# Patient Record
Sex: Male | Born: 1935
Health system: Southern US, Community
[De-identification: ages and names within clinical notes are randomized; demographics above are authoritative.]

## PROBLEM LIST (undated history)

## (undated) DIAGNOSIS — I10 Essential (primary) hypertension: Secondary | ICD-10-CM

## (undated) DIAGNOSIS — J45909 Unspecified asthma, uncomplicated: Secondary | ICD-10-CM

## (undated) DIAGNOSIS — I219 Acute myocardial infarction, unspecified: Secondary | ICD-10-CM

## (undated) DIAGNOSIS — N183 Chronic kidney disease, stage 3 unspecified: Secondary | ICD-10-CM

## (undated) DIAGNOSIS — J810 Acute pulmonary edema: Secondary | ICD-10-CM

## (undated) DIAGNOSIS — I509 Heart failure, unspecified: Secondary | ICD-10-CM

## (undated) HISTORY — PX: CARDIAC SURGERY: SHX584

---

## 1998-08-19 ENCOUNTER — Encounter: Payer: Self-pay | Admitting: Emergency Medicine

## 1998-08-19 ENCOUNTER — Emergency Department (HOSPITAL_COMMUNITY): Admission: EM | Admit: 1998-08-19 | Discharge: 1998-08-19 | Payer: Self-pay | Admitting: Emergency Medicine

## 2000-02-25 ENCOUNTER — Encounter: Payer: Self-pay | Admitting: Emergency Medicine

## 2000-02-26 ENCOUNTER — Encounter: Payer: Self-pay | Admitting: *Deleted

## 2000-02-26 ENCOUNTER — Inpatient Hospital Stay (HOSPITAL_COMMUNITY): Admission: EM | Admit: 2000-02-26 | Discharge: 2000-03-03 | Payer: Self-pay | Admitting: Emergency Medicine

## 2000-02-28 ENCOUNTER — Encounter: Payer: Self-pay | Admitting: Cardiothoracic Surgery

## 2000-02-29 ENCOUNTER — Encounter: Payer: Self-pay | Admitting: Cardiothoracic Surgery

## 2000-03-01 ENCOUNTER — Encounter: Payer: Self-pay | Admitting: Cardiothoracic Surgery

## 2015-12-21 DIAGNOSIS — R0902 Hypoxemia: Secondary | ICD-10-CM | POA: Diagnosis not present

## 2015-12-21 DIAGNOSIS — J449 Chronic obstructive pulmonary disease, unspecified: Secondary | ICD-10-CM | POA: Diagnosis not present

## 2016-01-21 DIAGNOSIS — R0902 Hypoxemia: Secondary | ICD-10-CM | POA: Diagnosis not present

## 2016-01-21 DIAGNOSIS — J449 Chronic obstructive pulmonary disease, unspecified: Secondary | ICD-10-CM | POA: Diagnosis not present

## 2016-02-18 DIAGNOSIS — R0902 Hypoxemia: Secondary | ICD-10-CM | POA: Diagnosis not present

## 2016-02-18 DIAGNOSIS — J449 Chronic obstructive pulmonary disease, unspecified: Secondary | ICD-10-CM | POA: Diagnosis not present

## 2016-03-20 DIAGNOSIS — R0902 Hypoxemia: Secondary | ICD-10-CM | POA: Diagnosis not present

## 2016-03-20 DIAGNOSIS — J449 Chronic obstructive pulmonary disease, unspecified: Secondary | ICD-10-CM | POA: Diagnosis not present

## 2016-04-19 DIAGNOSIS — J449 Chronic obstructive pulmonary disease, unspecified: Secondary | ICD-10-CM | POA: Diagnosis not present

## 2016-04-19 DIAGNOSIS — R0902 Hypoxemia: Secondary | ICD-10-CM | POA: Diagnosis not present

## 2016-05-20 DIAGNOSIS — R0902 Hypoxemia: Secondary | ICD-10-CM | POA: Diagnosis not present

## 2016-05-20 DIAGNOSIS — J449 Chronic obstructive pulmonary disease, unspecified: Secondary | ICD-10-CM | POA: Diagnosis not present

## 2016-06-19 DIAGNOSIS — R0902 Hypoxemia: Secondary | ICD-10-CM | POA: Diagnosis not present

## 2016-06-19 DIAGNOSIS — J449 Chronic obstructive pulmonary disease, unspecified: Secondary | ICD-10-CM | POA: Diagnosis not present

## 2016-07-20 DIAGNOSIS — J449 Chronic obstructive pulmonary disease, unspecified: Secondary | ICD-10-CM | POA: Diagnosis not present

## 2016-07-20 DIAGNOSIS — R0902 Hypoxemia: Secondary | ICD-10-CM | POA: Diagnosis not present

## 2016-07-22 DIAGNOSIS — E7800 Pure hypercholesterolemia, unspecified: Secondary | ICD-10-CM | POA: Diagnosis not present

## 2016-07-22 DIAGNOSIS — I10 Essential (primary) hypertension: Secondary | ICD-10-CM | POA: Diagnosis not present

## 2016-07-22 DIAGNOSIS — J431 Panlobular emphysema: Secondary | ICD-10-CM | POA: Diagnosis not present

## 2016-08-20 DIAGNOSIS — J449 Chronic obstructive pulmonary disease, unspecified: Secondary | ICD-10-CM | POA: Diagnosis not present

## 2016-08-20 DIAGNOSIS — R0902 Hypoxemia: Secondary | ICD-10-CM | POA: Diagnosis not present

## 2016-09-19 DIAGNOSIS — R0902 Hypoxemia: Secondary | ICD-10-CM | POA: Diagnosis not present

## 2016-09-19 DIAGNOSIS — J449 Chronic obstructive pulmonary disease, unspecified: Secondary | ICD-10-CM | POA: Diagnosis not present

## 2016-10-20 DIAGNOSIS — J449 Chronic obstructive pulmonary disease, unspecified: Secondary | ICD-10-CM | POA: Diagnosis not present

## 2016-10-20 DIAGNOSIS — R0902 Hypoxemia: Secondary | ICD-10-CM | POA: Diagnosis not present

## 2016-11-19 DIAGNOSIS — J449 Chronic obstructive pulmonary disease, unspecified: Secondary | ICD-10-CM | POA: Diagnosis not present

## 2016-11-19 DIAGNOSIS — R0902 Hypoxemia: Secondary | ICD-10-CM | POA: Diagnosis not present

## 2016-12-20 DIAGNOSIS — R0902 Hypoxemia: Secondary | ICD-10-CM | POA: Diagnosis not present

## 2016-12-20 DIAGNOSIS — J449 Chronic obstructive pulmonary disease, unspecified: Secondary | ICD-10-CM | POA: Diagnosis not present

## 2017-01-20 DIAGNOSIS — J449 Chronic obstructive pulmonary disease, unspecified: Secondary | ICD-10-CM | POA: Diagnosis not present

## 2017-01-20 DIAGNOSIS — R0902 Hypoxemia: Secondary | ICD-10-CM | POA: Diagnosis not present

## 2017-01-29 DIAGNOSIS — J441 Chronic obstructive pulmonary disease with (acute) exacerbation: Secondary | ICD-10-CM | POA: Diagnosis not present

## 2017-01-29 DIAGNOSIS — J069 Acute upper respiratory infection, unspecified: Secondary | ICD-10-CM | POA: Diagnosis not present

## 2017-01-29 DIAGNOSIS — B9789 Other viral agents as the cause of diseases classified elsewhere: Secondary | ICD-10-CM | POA: Diagnosis not present

## 2017-02-17 DIAGNOSIS — R0902 Hypoxemia: Secondary | ICD-10-CM | POA: Diagnosis not present

## 2017-02-17 DIAGNOSIS — J449 Chronic obstructive pulmonary disease, unspecified: Secondary | ICD-10-CM | POA: Diagnosis not present

## 2017-03-20 DIAGNOSIS — J449 Chronic obstructive pulmonary disease, unspecified: Secondary | ICD-10-CM | POA: Diagnosis not present

## 2017-03-20 DIAGNOSIS — R0902 Hypoxemia: Secondary | ICD-10-CM | POA: Diagnosis not present

## 2017-04-19 DIAGNOSIS — J449 Chronic obstructive pulmonary disease, unspecified: Secondary | ICD-10-CM | POA: Diagnosis not present

## 2017-04-19 DIAGNOSIS — R0902 Hypoxemia: Secondary | ICD-10-CM | POA: Diagnosis not present

## 2017-05-20 DIAGNOSIS — R0902 Hypoxemia: Secondary | ICD-10-CM | POA: Diagnosis not present

## 2017-05-20 DIAGNOSIS — J449 Chronic obstructive pulmonary disease, unspecified: Secondary | ICD-10-CM | POA: Diagnosis not present

## 2017-06-19 DIAGNOSIS — R0902 Hypoxemia: Secondary | ICD-10-CM | POA: Diagnosis not present

## 2017-06-19 DIAGNOSIS — J449 Chronic obstructive pulmonary disease, unspecified: Secondary | ICD-10-CM | POA: Diagnosis not present

## 2017-07-20 DIAGNOSIS — J449 Chronic obstructive pulmonary disease, unspecified: Secondary | ICD-10-CM | POA: Diagnosis not present

## 2017-07-20 DIAGNOSIS — R0902 Hypoxemia: Secondary | ICD-10-CM | POA: Diagnosis not present

## 2017-07-21 DIAGNOSIS — E782 Mixed hyperlipidemia: Secondary | ICD-10-CM | POA: Diagnosis not present

## 2017-07-21 DIAGNOSIS — K219 Gastro-esophageal reflux disease without esophagitis: Secondary | ICD-10-CM | POA: Diagnosis not present

## 2017-07-21 DIAGNOSIS — I10 Essential (primary) hypertension: Secondary | ICD-10-CM | POA: Diagnosis not present

## 2017-07-21 DIAGNOSIS — J4540 Moderate persistent asthma, uncomplicated: Secondary | ICD-10-CM | POA: Diagnosis not present

## 2017-07-31 DIAGNOSIS — I10 Essential (primary) hypertension: Secondary | ICD-10-CM | POA: Diagnosis not present

## 2017-07-31 DIAGNOSIS — E782 Mixed hyperlipidemia: Secondary | ICD-10-CM | POA: Diagnosis not present

## 2017-08-20 DIAGNOSIS — R0902 Hypoxemia: Secondary | ICD-10-CM | POA: Diagnosis not present

## 2017-08-20 DIAGNOSIS — J449 Chronic obstructive pulmonary disease, unspecified: Secondary | ICD-10-CM | POA: Diagnosis not present

## 2017-08-28 DIAGNOSIS — E875 Hyperkalemia: Secondary | ICD-10-CM | POA: Diagnosis not present

## 2017-09-19 DIAGNOSIS — R0902 Hypoxemia: Secondary | ICD-10-CM | POA: Diagnosis not present

## 2017-09-19 DIAGNOSIS — J449 Chronic obstructive pulmonary disease, unspecified: Secondary | ICD-10-CM | POA: Diagnosis not present

## 2017-10-20 DIAGNOSIS — J449 Chronic obstructive pulmonary disease, unspecified: Secondary | ICD-10-CM | POA: Diagnosis not present

## 2017-10-20 DIAGNOSIS — R0902 Hypoxemia: Secondary | ICD-10-CM | POA: Diagnosis not present

## 2017-11-19 DIAGNOSIS — J449 Chronic obstructive pulmonary disease, unspecified: Secondary | ICD-10-CM | POA: Diagnosis not present

## 2017-11-19 DIAGNOSIS — R0902 Hypoxemia: Secondary | ICD-10-CM | POA: Diagnosis not present

## 2017-12-20 DIAGNOSIS — J449 Chronic obstructive pulmonary disease, unspecified: Secondary | ICD-10-CM | POA: Diagnosis not present

## 2017-12-20 DIAGNOSIS — R0902 Hypoxemia: Secondary | ICD-10-CM | POA: Diagnosis not present

## 2018-01-20 DIAGNOSIS — J449 Chronic obstructive pulmonary disease, unspecified: Secondary | ICD-10-CM | POA: Diagnosis not present

## 2018-01-20 DIAGNOSIS — R0902 Hypoxemia: Secondary | ICD-10-CM | POA: Diagnosis not present

## 2018-02-09 DIAGNOSIS — J441 Chronic obstructive pulmonary disease with (acute) exacerbation: Secondary | ICD-10-CM | POA: Diagnosis not present

## 2018-02-09 DIAGNOSIS — R7989 Other specified abnormal findings of blood chemistry: Secondary | ICD-10-CM | POA: Diagnosis not present

## 2018-02-17 DIAGNOSIS — R0902 Hypoxemia: Secondary | ICD-10-CM | POA: Diagnosis not present

## 2018-02-17 DIAGNOSIS — J449 Chronic obstructive pulmonary disease, unspecified: Secondary | ICD-10-CM | POA: Diagnosis not present

## 2018-02-20 DIAGNOSIS — I10 Essential (primary) hypertension: Secondary | ICD-10-CM | POA: Diagnosis not present

## 2018-02-20 DIAGNOSIS — J441 Chronic obstructive pulmonary disease with (acute) exacerbation: Secondary | ICD-10-CM | POA: Diagnosis not present

## 2018-02-20 DIAGNOSIS — J40 Bronchitis, not specified as acute or chronic: Secondary | ICD-10-CM | POA: Diagnosis not present

## 2018-02-20 DIAGNOSIS — B370 Candidal stomatitis: Secondary | ICD-10-CM | POA: Diagnosis not present

## 2018-03-16 DIAGNOSIS — Z79899 Other long term (current) drug therapy: Secondary | ICD-10-CM | POA: Diagnosis not present

## 2018-03-16 DIAGNOSIS — D61818 Other pancytopenia: Secondary | ICD-10-CM | POA: Diagnosis not present

## 2018-03-16 DIAGNOSIS — I872 Venous insufficiency (chronic) (peripheral): Secondary | ICD-10-CM | POA: Diagnosis not present

## 2018-03-16 DIAGNOSIS — R600 Localized edema: Secondary | ICD-10-CM | POA: Diagnosis not present

## 2018-03-16 DIAGNOSIS — N183 Chronic kidney disease, stage 3 (moderate): Secondary | ICD-10-CM | POA: Diagnosis not present

## 2018-03-17 ENCOUNTER — Emergency Department (HOSPITAL_COMMUNITY): Payer: PPO

## 2018-03-17 ENCOUNTER — Other Ambulatory Visit (HOSPITAL_COMMUNITY): Payer: Self-pay

## 2018-03-17 ENCOUNTER — Other Ambulatory Visit: Payer: Self-pay

## 2018-03-17 ENCOUNTER — Encounter (HOSPITAL_COMMUNITY): Payer: Self-pay

## 2018-03-17 ENCOUNTER — Inpatient Hospital Stay (HOSPITAL_COMMUNITY)
Admission: EM | Admit: 2018-03-17 | Discharge: 2018-03-22 | DRG: 291 | Disposition: A | Discharge status: Home Health | Payer: PPO | Attending: Internal Medicine | Admitting: Internal Medicine

## 2018-03-17 DIAGNOSIS — I5041 Acute combined systolic (congestive) and diastolic (congestive) heart failure: Secondary | ICD-10-CM | POA: Diagnosis not present

## 2018-03-17 DIAGNOSIS — E878 Other disorders of electrolyte and fluid balance, not elsewhere classified: Secondary | ICD-10-CM | POA: Diagnosis not present

## 2018-03-17 DIAGNOSIS — Z9981 Dependence on supplemental oxygen: Secondary | ICD-10-CM | POA: Diagnosis not present

## 2018-03-17 DIAGNOSIS — I2510 Atherosclerotic heart disease of native coronary artery without angina pectoris: Secondary | ICD-10-CM | POA: Diagnosis present

## 2018-03-17 DIAGNOSIS — I252 Old myocardial infarction: Secondary | ICD-10-CM

## 2018-03-17 DIAGNOSIS — I452 Bifascicular block: Secondary | ICD-10-CM | POA: Diagnosis present

## 2018-03-17 DIAGNOSIS — R0902 Hypoxemia: Secondary | ICD-10-CM | POA: Diagnosis not present

## 2018-03-17 DIAGNOSIS — Z7982 Long term (current) use of aspirin: Secondary | ICD-10-CM | POA: Diagnosis not present

## 2018-03-17 DIAGNOSIS — I5021 Acute systolic (congestive) heart failure: Secondary | ICD-10-CM | POA: Diagnosis not present

## 2018-03-17 DIAGNOSIS — I255 Ischemic cardiomyopathy: Secondary | ICD-10-CM | POA: Diagnosis not present

## 2018-03-17 DIAGNOSIS — I25700 Atherosclerosis of coronary artery bypass graft(s), unspecified, with unstable angina pectoris: Secondary | ICD-10-CM | POA: Diagnosis not present

## 2018-03-17 DIAGNOSIS — T502X5A Adverse effect of carbonic-anhydrase inhibitors, benzothiadiazides and other diuretics, initial encounter: Secondary | ICD-10-CM | POA: Diagnosis not present

## 2018-03-17 DIAGNOSIS — J9611 Chronic respiratory failure with hypoxia: Secondary | ICD-10-CM | POA: Diagnosis not present

## 2018-03-17 DIAGNOSIS — I130 Hypertensive heart and chronic kidney disease with heart failure and stage 1 through stage 4 chronic kidney disease, or unspecified chronic kidney disease: Secondary | ICD-10-CM | POA: Diagnosis not present

## 2018-03-17 DIAGNOSIS — N183 Chronic kidney disease, stage 3 (moderate): Secondary | ICD-10-CM | POA: Diagnosis present

## 2018-03-17 DIAGNOSIS — J449 Chronic obstructive pulmonary disease, unspecified: Secondary | ICD-10-CM | POA: Diagnosis present

## 2018-03-17 DIAGNOSIS — I872 Venous insufficiency (chronic) (peripheral): Secondary | ICD-10-CM | POA: Diagnosis not present

## 2018-03-17 DIAGNOSIS — R600 Localized edema: Secondary | ICD-10-CM | POA: Diagnosis not present

## 2018-03-17 DIAGNOSIS — I129 Hypertensive chronic kidney disease with stage 1 through stage 4 chronic kidney disease, or unspecified chronic kidney disease: Secondary | ICD-10-CM | POA: Diagnosis not present

## 2018-03-17 DIAGNOSIS — L03116 Cellulitis of left lower limb: Secondary | ICD-10-CM | POA: Diagnosis not present

## 2018-03-17 DIAGNOSIS — R0600 Dyspnea, unspecified: Secondary | ICD-10-CM | POA: Diagnosis not present

## 2018-03-17 DIAGNOSIS — K219 Gastro-esophageal reflux disease without esophagitis: Secondary | ICD-10-CM | POA: Diagnosis present

## 2018-03-17 DIAGNOSIS — R0602 Shortness of breath: Secondary | ICD-10-CM | POA: Diagnosis not present

## 2018-03-17 DIAGNOSIS — Z6832 Body mass index (BMI) 32.0-32.9, adult: Secondary | ICD-10-CM | POA: Diagnosis not present

## 2018-03-17 DIAGNOSIS — Z7951 Long term (current) use of inhaled steroids: Secondary | ICD-10-CM

## 2018-03-17 DIAGNOSIS — E785 Hyperlipidemia, unspecified: Secondary | ICD-10-CM | POA: Diagnosis present

## 2018-03-17 DIAGNOSIS — Z951 Presence of aortocoronary bypass graft: Secondary | ICD-10-CM

## 2018-03-17 DIAGNOSIS — I4581 Long QT syndrome: Secondary | ICD-10-CM | POA: Diagnosis present

## 2018-03-17 DIAGNOSIS — Z720 Tobacco use: Secondary | ICD-10-CM | POA: Diagnosis not present

## 2018-03-17 DIAGNOSIS — N179 Acute kidney failure, unspecified: Secondary | ICD-10-CM

## 2018-03-17 DIAGNOSIS — J810 Acute pulmonary edema: Secondary | ICD-10-CM

## 2018-03-17 HISTORY — DX: Essential (primary) hypertension: I10

## 2018-03-17 HISTORY — DX: Chronic kidney disease, stage 3 unspecified: N18.30

## 2018-03-17 HISTORY — DX: Acute pulmonary edema: J81.0

## 2018-03-17 HISTORY — DX: Chronic kidney disease, stage 3 (moderate): N18.3

## 2018-03-17 HISTORY — DX: Heart failure, unspecified: I50.9

## 2018-03-17 HISTORY — DX: Unspecified asthma, uncomplicated: J45.909

## 2018-03-17 HISTORY — DX: Acute myocardial infarction, unspecified: I21.9

## 2018-03-17 LAB — CBC
HEMATOCRIT: 33.2 % — AB (ref 39.0–52.0)
Hemoglobin: 9.9 g/dL — ABNORMAL LOW (ref 13.0–17.0)
MCH: 26.5 pg (ref 26.0–34.0)
MCHC: 29.8 g/dL — ABNORMAL LOW (ref 30.0–36.0)
MCV: 89 fL (ref 78.0–100.0)
Platelets: 101 10*3/uL — ABNORMAL LOW (ref 150–400)
RBC: 3.73 MIL/uL — AB (ref 4.22–5.81)
RDW: 16 % — AB (ref 11.5–15.5)
WBC: 6.2 10*3/uL (ref 4.0–10.5)

## 2018-03-17 LAB — BASIC METABOLIC PANEL
ANION GAP: 10 (ref 5–15)
BUN: 21 mg/dL — AB (ref 6–20)
CO2: 30 mmol/L (ref 22–32)
Calcium: 8.7 mg/dL — ABNORMAL LOW (ref 8.9–10.3)
Chloride: 99 mmol/L — ABNORMAL LOW (ref 101–111)
Creatinine, Ser: 1.17 mg/dL (ref 0.61–1.24)
GFR, EST NON AFRICAN AMERICAN: 56 mL/min — AB (ref >60)
Glucose, Bld: 151 mg/dL — ABNORMAL HIGH (ref 65–99)
POTASSIUM: 3.7 mmol/L (ref 3.5–5.1)
SODIUM: 139 mmol/L (ref 135–145)

## 2018-03-17 LAB — I-STAT TROPONIN, ED: Troponin i, poc: 0.04 ng/mL (ref 0.00–0.08)

## 2018-03-17 LAB — BRAIN NATRIURETIC PEPTIDE: B NATRIURETIC PEPTIDE 5: 1234.4 pg/mL — AB (ref 0.0–100.0)

## 2018-03-17 NOTE — ED Triage Notes (Signed)
Pt presents with 2 week h/o increased shortness of breath and BLE edema.  Pt seen at PCP yesterday, lasix was adjusted, pt referred here.

## 2018-03-18 ENCOUNTER — Other Ambulatory Visit: Payer: Self-pay

## 2018-03-18 ENCOUNTER — Inpatient Hospital Stay (HOSPITAL_COMMUNITY): Payer: PPO

## 2018-03-18 ENCOUNTER — Encounter (HOSPITAL_COMMUNITY): Payer: Self-pay | Admitting: Internal Medicine

## 2018-03-18 DIAGNOSIS — I5021 Acute systolic (congestive) heart failure: Secondary | ICD-10-CM

## 2018-03-18 DIAGNOSIS — N179 Acute kidney failure, unspecified: Secondary | ICD-10-CM

## 2018-03-18 DIAGNOSIS — J810 Acute pulmonary edema: Secondary | ICD-10-CM

## 2018-03-18 DIAGNOSIS — R0600 Dyspnea, unspecified: Secondary | ICD-10-CM

## 2018-03-18 DIAGNOSIS — N183 Chronic kidney disease, stage 3 unspecified: Secondary | ICD-10-CM

## 2018-03-18 DIAGNOSIS — E785 Hyperlipidemia, unspecified: Secondary | ICD-10-CM | POA: Diagnosis present

## 2018-03-18 DIAGNOSIS — I452 Bifascicular block: Secondary | ICD-10-CM | POA: Diagnosis present

## 2018-03-18 DIAGNOSIS — L03116 Cellulitis of left lower limb: Secondary | ICD-10-CM

## 2018-03-18 DIAGNOSIS — I4581 Long QT syndrome: Secondary | ICD-10-CM | POA: Diagnosis present

## 2018-03-18 DIAGNOSIS — Z9981 Dependence on supplemental oxygen: Secondary | ICD-10-CM | POA: Diagnosis not present

## 2018-03-18 DIAGNOSIS — Z951 Presence of aortocoronary bypass graft: Secondary | ICD-10-CM | POA: Diagnosis not present

## 2018-03-18 DIAGNOSIS — Z7951 Long term (current) use of inhaled steroids: Secondary | ICD-10-CM | POA: Diagnosis not present

## 2018-03-18 DIAGNOSIS — I5041 Acute combined systolic (congestive) and diastolic (congestive) heart failure: Secondary | ICD-10-CM | POA: Insufficient documentation

## 2018-03-18 DIAGNOSIS — Z7982 Long term (current) use of aspirin: Secondary | ICD-10-CM | POA: Diagnosis not present

## 2018-03-18 DIAGNOSIS — R600 Localized edema: Secondary | ICD-10-CM

## 2018-03-18 DIAGNOSIS — Z6832 Body mass index (BMI) 32.0-32.9, adult: Secondary | ICD-10-CM | POA: Diagnosis not present

## 2018-03-18 DIAGNOSIS — K219 Gastro-esophageal reflux disease without esophagitis: Secondary | ICD-10-CM | POA: Diagnosis present

## 2018-03-18 DIAGNOSIS — E878 Other disorders of electrolyte and fluid balance, not elsewhere classified: Secondary | ICD-10-CM | POA: Diagnosis present

## 2018-03-18 DIAGNOSIS — I2510 Atherosclerotic heart disease of native coronary artery without angina pectoris: Secondary | ICD-10-CM | POA: Diagnosis present

## 2018-03-18 DIAGNOSIS — I130 Hypertensive heart and chronic kidney disease with heart failure and stage 1 through stage 4 chronic kidney disease, or unspecified chronic kidney disease: Secondary | ICD-10-CM | POA: Diagnosis present

## 2018-03-18 DIAGNOSIS — I255 Ischemic cardiomyopathy: Secondary | ICD-10-CM | POA: Diagnosis present

## 2018-03-18 DIAGNOSIS — T502X5A Adverse effect of carbonic-anhydrase inhibitors, benzothiadiazides and other diuretics, initial encounter: Secondary | ICD-10-CM | POA: Diagnosis not present

## 2018-03-18 DIAGNOSIS — J9611 Chronic respiratory failure with hypoxia: Secondary | ICD-10-CM | POA: Diagnosis present

## 2018-03-18 DIAGNOSIS — Z720 Tobacco use: Secondary | ICD-10-CM | POA: Diagnosis not present

## 2018-03-18 DIAGNOSIS — J449 Chronic obstructive pulmonary disease, unspecified: Secondary | ICD-10-CM | POA: Diagnosis present

## 2018-03-18 DIAGNOSIS — I252 Old myocardial infarction: Secondary | ICD-10-CM | POA: Diagnosis not present

## 2018-03-18 DIAGNOSIS — I872 Venous insufficiency (chronic) (peripheral): Secondary | ICD-10-CM | POA: Diagnosis present

## 2018-03-18 HISTORY — DX: Acute pulmonary edema: J81.0

## 2018-03-18 LAB — CREATININE, SERUM
Creatinine, Ser: 1.12 mg/dL (ref 0.61–1.24)
GFR, EST NON AFRICAN AMERICAN: 59 mL/min — AB (ref >60)

## 2018-03-18 LAB — ECHOCARDIOGRAM COMPLETE

## 2018-03-18 LAB — CBC
HEMATOCRIT: 33.2 % — AB (ref 39.0–52.0)
HEMOGLOBIN: 10 g/dL — AB (ref 13.0–17.0)
MCH: 26.7 pg (ref 26.0–34.0)
MCHC: 30.1 g/dL (ref 30.0–36.0)
MCV: 88.8 fL (ref 78.0–100.0)
Platelets: 94 10*3/uL — ABNORMAL LOW (ref 150–400)
RBC: 3.74 MIL/uL — AB (ref 4.22–5.81)
RDW: 15.9 % — ABNORMAL HIGH (ref 11.5–15.5)
WBC: 5.9 10*3/uL (ref 4.0–10.5)

## 2018-03-18 MED ORDER — SODIUM CHLORIDE 0.9% FLUSH: 3.0000 mL | INTRAVENOUS | Status: DC | PRN

## 2018-03-18 MED ORDER — FUROSEMIDE 10 MG/ML IJ SOLN
60.0000 mg | Freq: Once | INTRAMUSCULAR | Status: AC
Start: 2018-03-18 — End: 2018-03-18
  Administered 2018-03-18: 60 mg via INTRAVENOUS
  Filled 2018-03-18: qty 6

## 2018-03-18 MED ORDER — DOXYCYCLINE HYCLATE 100 MG PO TABS
100.0000 mg | ORAL_TABLET | Freq: Once | ORAL | Status: AC
  Administered 2018-03-18: 100 mg via ORAL
  Filled 2018-03-18: qty 1

## 2018-03-18 MED ORDER — PANTOPRAZOLE SODIUM 40 MG PO TBEC
40.0000 mg | DELAYED_RELEASE_TABLET | Freq: Every day | ORAL | Status: DC
  Administered 2018-03-18 – 2018-03-22 (×5): 40 mg via ORAL
  Filled 2018-03-18 (×5): qty 1

## 2018-03-18 MED ORDER — PERFLUTREN LIPID MICROSPHERE
1.0000 mL | INTRAVENOUS | Status: AC | PRN
  Administered 2018-03-18: 2 mL via INTRAVENOUS
  Filled 2018-03-18: qty 10

## 2018-03-18 MED ORDER — DOXAZOSIN MESYLATE 8 MG PO TABS
4.0000 mg | ORAL_TABLET | Freq: Every day | ORAL | Status: DC
  Administered 2018-03-18 – 2018-03-20 (×3): 4 mg via ORAL
  Filled 2018-03-18: qty 1
  Filled 2018-03-18: qty 2
  Filled 2018-03-18 (×2): qty 1

## 2018-03-18 MED ORDER — FUROSEMIDE 10 MG/ML IJ SOLN: 80.0000 mg | Freq: Once | INTRAMUSCULAR | Status: DC

## 2018-03-18 MED ORDER — FUROSEMIDE 10 MG/ML IJ SOLN
60.0000 mg | Freq: Two times a day (BID) | INTRAMUSCULAR | Status: DC
  Administered 2018-03-18 – 2018-03-21 (×6): 60 mg via INTRAVENOUS
  Filled 2018-03-18 (×6): qty 6

## 2018-03-18 MED ORDER — MAGNESIUM SULFATE 2 GM/50ML IV SOLN
2.0000 g | Freq: Once | INTRAVENOUS | Status: AC
  Administered 2018-03-18: 2 g via INTRAVENOUS
  Filled 2018-03-18: qty 50

## 2018-03-18 MED ORDER — ASPIRIN EC 325 MG PO TBEC
325.0000 mg | DELAYED_RELEASE_TABLET | Freq: Every day | ORAL | Status: DC
  Administered 2018-03-18 – 2018-03-22 (×5): 325 mg via ORAL
  Filled 2018-03-18 (×5): qty 1

## 2018-03-18 MED ORDER — SODIUM CHLORIDE 0.9% FLUSH
3.0000 mL | Freq: Two times a day (BID) | INTRAVENOUS | Status: DC
  Administered 2018-03-18 – 2018-03-22 (×8): 3 mL via INTRAVENOUS

## 2018-03-18 MED ORDER — HEPARIN SODIUM (PORCINE) 5000 UNIT/ML IJ SOLN
5000.0000 [IU] | Freq: Three times a day (TID) | INTRAMUSCULAR | Status: DC
  Administered 2018-03-18 – 2018-03-22 (×12): 5000 [IU] via SUBCUTANEOUS
  Filled 2018-03-18 (×12): qty 1

## 2018-03-18 MED ORDER — POTASSIUM CHLORIDE CRYS ER 20 MEQ PO TBCR
40.0000 meq | EXTENDED_RELEASE_TABLET | Freq: Two times a day (BID) | ORAL | Status: AC
  Administered 2018-03-18 (×2): 40 meq via ORAL
  Filled 2018-03-18 (×2): qty 2

## 2018-03-18 MED ORDER — ONDANSETRON HCL 4 MG/2ML IJ SOLN: 4.0000 mg | Freq: Four times a day (QID) | INTRAMUSCULAR | Status: DC | PRN

## 2018-03-18 MED ORDER — FUROSEMIDE 10 MG/ML IJ SOLN
80.0000 mg | Freq: Once | INTRAMUSCULAR | Status: AC
Start: 2018-03-18 — End: 2018-03-18
  Administered 2018-03-18: 80 mg via INTRAVENOUS
  Filled 2018-03-18: qty 8

## 2018-03-18 MED ORDER — IPRATROPIUM-ALBUTEROL 0.5-2.5 (3) MG/3ML IN SOLN
3.0000 mL | RESPIRATORY_TRACT | Status: DC | PRN
  Administered 2018-03-19 – 2018-03-20 (×2): 3 mL via RESPIRATORY_TRACT
  Filled 2018-03-18 (×2): qty 3

## 2018-03-18 MED ORDER — ACETAMINOPHEN 325 MG PO TABS
650.0000 mg | ORAL_TABLET | ORAL | Status: DC | PRN
  Administered 2018-03-21: 650 mg via ORAL
  Filled 2018-03-18: qty 2

## 2018-03-18 MED ORDER — SODIUM CHLORIDE 0.9 % IV SOLN: 250.0000 mL | INTRAVENOUS | Status: DC | PRN

## 2018-03-18 MED ORDER — UMECLIDINIUM-VILANTEROL 62.5-25 MCG/INH IN AEPB
1.0000 | INHALATION_SPRAY | Freq: Every day | RESPIRATORY_TRACT | Status: DC
  Administered 2018-03-20 – 2018-03-22 (×3): 1 via RESPIRATORY_TRACT
  Filled 2018-03-18 (×3): qty 14

## 2018-03-18 MED ORDER — LISINOPRIL 5 MG PO TABS
5.0000 mg | ORAL_TABLET | Freq: Every day | ORAL | Status: DC
  Administered 2018-03-18 – 2018-03-20 (×2): 5 mg via ORAL
  Filled 2018-03-18 (×4): qty 1

## 2018-03-18 NOTE — ED Notes (Addendum)
Report accepted by Corrie DandyMary, RN

## 2018-03-18 NOTE — ED Provider Notes (Signed)
[facility] Provider Note   CSN: [id number] Arrival date & time: 03/17/18  2004     History   Chief Complaint Chief Complaint  Patient presents with  . Shortness of Breath    HPI Robert Maynard is a 84 y.o. male.  HPI  This is an 84 year old male with a history of hypertension, coronary artery disease, asthma who presents with worsening lower extremity edema and shortness of breath.  At baseline, patient is on 2 L of home oxygen and sleeps in a recliner.  He progressively over the last several weeks has not been able to lay the recliner flat secondary to shortness of breath.  Also has had notably increased bilateral lower extremity edema and weeping most from his left lower extremity.  He was seen by his primary doctor on Monday.  Told to double up on his Lasix.  He takes Lasix 20 mg twice daily as needed for swelling.  Patient reports that he took 40 mg on Monday night and Tuesday morning.  If anything his lower extremity swelling worsened.  He reports decreased urine output.  He denies any chest pain.  He denies any recent coughs or fevers.  He has noted some left lower extremity redness and weeping secondary to swelling.  Past Medical History:  Diagnosis Date  . Asthma   . Hypertension   . MI (myocardial infarction) (HCC)     There are no active problems to display for this patient.   Past Surgical History:  Procedure Laterality Date  . CARDIAC SURGERY          Home Medications    Prior to Admission medications   Not on File    Family History History reviewed. No pertinent family history.  Social History Social History   Tobacco Use  . Smoking status: Former Games developermoker  . Smokeless tobacco: Current User    Types: Chew  Substance Use Topics  . Alcohol use: Not on file  . Drug use: Not on file     Allergies   Penicillins   Review of Systems Review of Systems  Constitutional: Negative for fever.  Respiratory:  Positive for shortness of breath. Negative for cough.   Cardiovascular: Positive for leg swelling. Negative for chest pain.  Gastrointestinal: Negative for abdominal pain, nausea and vomiting.  Genitourinary:       Decreased urination  Skin: Positive for color change.  All other systems reviewed and are negative.    Physical Exam Updated Vital Signs BP 121/76 (BP Location: Right Arm)   Pulse 65   Temp 98.4 F (36.9 C) (Oral)   Resp 16   SpO2 99%   Physical Exam  Constitutional: He is oriented to person, place, and time.  Elderly, ill-appearing but nontoxic  HENT:  Head: Normocephalic and atraumatic.  Eyes: Pupils are equal, round, and reactive to light.  Cardiovascular: Normal rate, regular rhythm and normal heart sounds.  No murmur heard. Pulmonary/Chest: Tachypnea noted. No respiratory distress. He has wheezes.  Tachypnea with pursed lips, increased work of breathing, wheezing noted in all lung fields with crackles in the bilateral bases  Abdominal: Soft. Bowel sounds are normal. There is no tenderness. There is no rebound.  Musculoskeletal:       Right lower leg: He exhibits edema.       Left lower leg: He exhibits edema.  3+ pitting edema with weeping bilateral lower extremities  Lymphadenopathy:    He has no cervical adenopathy.  Neurological: He  is alert and oriented to person, place, and time.  Skin: Skin is warm and dry.  Erythema and warmth noted over the left shin, blanching  Psychiatric: He has a normal mood and affect.  Nursing note and vitals reviewed.    ED Treatments / Results  Labs (all labs ordered are listed, but only abnormal results are displayed) Labs Reviewed  BASIC METABOLIC PANEL - Abnormal; Notable for the following components:      Result Value   Chloride 99 (*)    Glucose, Bld 151 (*)    BUN 21 (*)    Calcium 8.7 (*)    GFR calc non Af Amer 56 (*)    All other components within normal limits  CBC - Abnormal; Notable for the following  components:   RBC 3.73 (*)    Hemoglobin 9.9 (*)    HCT 33.2 (*)    MCHC 29.8 (*)    RDW 16.0 (*)    Platelets 101 (*)    All other components within normal limits  BRAIN NATRIURETIC PEPTIDE - Abnormal; Notable for the following components:   B Natriuretic Peptide 1,234.4 (*)    All other components within normal limits  I-STAT TROPONIN, ED    EKG EKG Interpretation  Date/Time:  Tuesday March 17 2018 21:37:37 EDT Ventricular Rate:  98 PR Interval:  194 QRS Duration: 150 QT Interval:  424 QTC Calculation: 541 R Axis:   -73 Text Interpretation:  Sinus rhythm with Fusion complexes and Premature atrial complexes Left axis deviation Left ventricular hypertrophy with QRS widening and repolarization abnormality Nonspecific intraventricular conduction delay Inferior infarct , age undetermined Anterolateral infarct , age undetermined Abnormal ECG Confirmed by Ross Marcus ([id number]) on 03/18/2018 4:01:07 AM   Radiology Dg Chest 2 View  Result Date: 03/17/2018 CLINICAL DATA:  Two week history of increased shortness of breath and bilateral lower extremity edema. EXAM: CHEST - 2 VIEW COMPARISON:  None. FINDINGS: Postoperative changes in the mediastinum. Fractures of 2 superior sternotomy wires. Diffuse cardiac enlargement. Mild pulmonary vascular congestion. Slight interstitial pattern in the lung bases likely due to edema. Small bilateral pleural effusions. No focal consolidation. No pneumothorax. Calcified and tortuous aorta. Degenerative changes in the spine. IMPRESSION: 1. Congestive changes with cardiac enlargement, pulmonary vascular congestion, interstitial edema, and bilateral pleural effusions. 2. Aortic atherosclerosis. Electronically Signed   By: Burman Nieves M.D.   On: 03/17/2018 22:27    Procedures Procedures (including critical care time)  Medications Ordered in ED Medications  furosemide (LASIX) injection 60 mg (has no administration in time range)     Initial  Impression / Assessment and Plan / ED Course  I have reviewed the triage vital signs and the nursing notes.  Pertinent labs & imaging results that were available during my care of the patient were reviewed by me and considered in my medical decision making (see chart for details).     Patient presents with progressively worsening lower extremity edema and shortness of breath.  Reports orthopnea.  He appears to be in mild respiratory distress with tachypnea and increased work of breathing.  He is satting 99% on 2 L nasal cannula.  He has extensive lower extremity swelling and likely left lower extremity cellulitis.  Chest x-ray shows evidence of vascular congestion and bilateral pleural effusions.  Unfortunately, I do not have any prior echo or additional information available.  Agree that he likely needs aggressive diuresis.  Patient takes 40 mg of Lasix daily normally.  Patient was given 29  mg of IV Lasix.  While patient is not hypoxic, given the extent of his volume overload, feel he needs admission for multiple doses of IV Lasix and diuresis given that he is not responding to oral Lasix at home.  Will discuss with admitting hospitalist.   Final Clinical Impressions(s) / ED Diagnoses   Final diagnoses:  Acute pulmonary edema (HCC)  Lower extremity edema  Left leg cellulitis    ED Discharge Orders    None       Shon Baton, MD 03/18/18 [phone]

## 2018-03-18 NOTE — Progress Notes (Signed)
Echocardiogram 2D Echocardiogram has been performed.  Robert Maynard 03/18/2018, 2:16 PM

## 2018-03-18 NOTE — Plan of Care (Signed)
  Problem: Education: Goal: Knowledge of General Education information will improve Outcome: Progressing   Problem: Clinical Measurements: Goal: Ability to maintain clinical measurements within normal limits will improve Outcome: Progressing   Problem: Clinical Measurements: Goal: Diagnostic test results will improve Outcome: Progressing   Problem: Clinical Measurements: Goal: Respiratory complications will improve Outcome: Progressing

## 2018-03-18 NOTE — Progress Notes (Signed)
Transported patient from 5c-18 in bed with 2 nurses and daughter gail. Patient is from home with wife. Has expressed that patient  has got weak with swelling and needs pt/ot eval.

## 2018-03-18 NOTE — ED Notes (Signed)
Attending at bedside.

## 2018-03-18 NOTE — ED Notes (Signed)
Repeat EKG was done and Given to Amitting Doctor.

## 2018-03-18 NOTE — H&P (Signed)
History and Physical    Robert Maynard DOB: [birth year] DOA: 03/17/2018  PCP: Brock Bad, FNP  Patient coming from: home  I have personally briefly reviewed patient's old medical records in [place]  Chief Complaint: SOB  HPI: Robert Maynard is a 84 y.o. male with medical history significant of past medical history of coronary artery disease, chronic kidney disease stage II, COPD dependent on 2 L of oxygen, asthma essential hypertension who is a poor historian so most of the history is obtained from the chart who comes in for lower extremity edema and shortness of breath he saw his primary care doctor on 03/16/2017 who he double his dose of Lasix but as per chart his shortness of breath did not improve, he had orthopnea and his lower extremity edema did not improve.  She denies any chest pain any fever, any new coughs.  ED Course:  Blood pressure seems to be stable his heart rate has been ranging from 88-99, he is mildly hypochloremic, with a BNP of 1200, hemoglobin of 9 and platelet count of 100.  Chest x-ray as below  Review of Systems: As per HPI otherwise 10 point review of systems negative.    Past Medical History:  Diagnosis Date  . Asthma   . Hypertension   . MI (myocardial infarction) [facility])     Past Surgical History:  Procedure Laterality Date  . CARDIAC SURGERY       reports that he has quit smoking. His smoking use included cigarettes. He has a 60.00 pack-year smoking history. His smokeless tobacco use includes chew. His alcohol and drug histories are not on file.  Allergies  Allergen Reactions  . Penicillins Rash    History reviewed. No pertinent family history. He relates his father died of heart attack at the age of 72  Prior to Admission medications   Medication Sig Start Date End Date Taking? Authorizing Provider  aspirin EC 325 MG tablet Take 325 mg by mouth daily.   Yes [provider]  doxazosin (CARDURA) 4 MG tablet  Take 4 mg by mouth daily.   Yes [provider]  furosemide (LASIX) 20 MG tablet Take 20 mg by mouth 2 (two) times daily as needed for fluid.   Yes [provider]  pantoprazole (PROTONIX) 40 MG tablet Take 40 mg by mouth daily.   Yes [provider]  umeclidinium-vilanterol (ANORO ELLIPTA) 62.5-25 MCG/INH AEPB Inhale 1 puff into the lungs daily.   Yes [provider]    Physical Exam: Vitals:   03/18/18 0158 03/18/18 0415 03/18/18 0615 03/18/18 0710  BP: 121/76 117/79 120/85 115/79  Pulse: 65 81 93 98  Resp: 16 20  (!) 27  Temp:      TempSrc:      SpO2: 99% 98% 96% 96%    Constitutional: NAD, calm, comfortable Vitals:   03/18/18 0158 03/18/18 0415 03/18/18 0615 03/18/18 0710  BP: 121/76 117/79 120/85 115/79  Pulse: 65 81 93 98  Resp: 16 20  (!) 27  Temp:      TempSrc:      SpO2: 99% 98% 96% 96%   Eyes: PERRL, lids and conjunctivae normal ENMT: Mucous membranes are moist. Posterior pharynx clear of any exudate or lesions.Normal dentition.  Neck: Supple no masses positive JVD to the earlobe Respiratory: Good air movement no wheezing but crackles bilaterally at bases Cardiovascular: Rate and rhythm with positive S1-S2, could appreciate an S3 Abdomen: no tenderness, no masses palpated.  No hepatosplenomegaly. Bowel sounds positive.  Musculoskeletal: Plus edema in his lower extremity Skin: Right lower extremity showed a significant erythema and warmth to touch Neurologic: CN 2-12 grossly intact. Sensation intact, DTR normal. Strength 5/5 in all 4.  Psychiatric: Normal judgment and insight. Alert and oriented x 3. Normal mood.     Labs on Admission: I have personally reviewed following labs and imaging studies  CBC: Recent Labs  Lab 03/17/18 2138  WBC 6.2  HGB 9.9*  HCT 33.2*  MCV 89.0  PLT 270*   Basic Metabolic Panel: Recent Labs  Lab 03/17/18 2138  NA 139  K 3.7  CL 99*  CO2 30  GLUCOSE 151*  BUN 21*  CREATININE 1.17    CALCIUM 8.7*   GFR: CrCl cannot be calculated (Unknown ideal weight.). Liver Function Tests: No results for input(s): AST, ALT, ALKPHOS, BILITOT, PROT, ALBUMIN in the last 168 hours. No results for input(s): LIPASE, AMYLASE in the last 168 hours. No results for input(s): AMMONIA in the last 168 hours. Coagulation Profile: No results for input(s): INR, PROTIME in the last 168 hours. Cardiac Enzymes: No results for input(s): CKTOTAL, CKMB, CKMBINDEX, TROPONINI in the last 168 hours. BNP (last 3 results) No results for input(s): PROBNP in the last 8760 hours. HbA1C: No results for input(s): HGBA1C in the last 72 hours. CBG: No results for input(s): GLUCAP in the last 168 hours. Lipid Profile: No results for input(s): CHOL, HDL, LDLCALC, TRIG, CHOLHDL, LDLDIRECT in the last 72 hours. Thyroid Function Tests: No results for input(s): TSH, T4TOTAL, FREET4, T3FREE, THYROIDAB in the last 72 hours. Anemia Panel: No results for input(s): VITAMINB12, FOLATE, FERRITIN, TIBC, IRON, RETICCTPCT in the last 72 hours. Urine analysis: No results found for: COLORURINE, APPEARANCEUR, LABSPEC, PHURINE, GLUCOSEU, HGBUR, BILIRUBINUR, KETONESUR, PROTEINUR, UROBILINOGEN, NITRITE, LEUKOCYTESUR  Radiological Exams on Admission: Dg Chest 2 View  Result Date: 03/17/2018 CLINICAL DATA:  Two week history of increased shortness of breath and bilateral lower extremity edema. EXAM: CHEST - 2 VIEW COMPARISON:  None. FINDINGS: Postoperative changes in the mediastinum. Fractures of 2 superior sternotomy wires. Diffuse cardiac enlargement. Mild pulmonary vascular congestion. Slight interstitial pattern in the lung bases likely due to edema. Small bilateral pleural effusions. No focal consolidation. No pneumothorax. Calcified and tortuous aorta. Degenerative changes in the spine. IMPRESSION: 1. Congestive changes with cardiac enlargement, pulmonary vascular congestion, interstitial edema, and bilateral pleural effusions. 2.  Aortic atherosclerosis. Electronically Signed   By: Lucienne Capers M.D.   On: 03/17/2018 22:27    EKG: Independently reviewed.  Shows sinus rhythm with multiple PVCs, left axis deviation right bundle branch block and left ventricular hypertrophy with a mildly prolonged QTC  Assessment/Plan Acute systolic CHF (congestive heart failure) ([place]) I have looked through the records including care everywhere I cannot find a 2D echo, his chest x-ray shows significantly dilated cardiac silhouette.  I could appreciate an S3 on physical exam, so I am concerned about a reduced ejection fraction, he has denied chest pain now or over the last 2 months. He is extremely fluid overloaded. We will start him on aggressive IV Lasix, replete potassium orally, will also repeat a magnesium and check it in the morning. We will restrict his fluids daily weight strict I's and O's and put him on a low-sodium diet.  We will monitor electrolytes regularly. I am concerned that his renal function is hemodiluted due to his significant volume overload. Expect this creatinine to go up as we had him to his estimated dry  weight. I cannot find an estimated dry weight in his chart. We will start him on low-dose lisinopril. Over the next 2 days we will have a low threshold to start a BRCA2 blocker on or before discharge  Cellulitis of left lower extremity Clearly has a left lower extremity cellulitis we will start her on IV doxy will check blood cultures.  CKD (chronic kidney disease), stage III ([place]) I think his creatinine is hemodiluted due to his massive volume overload. We will continue to monitor creatinine closely.  Prolonged QTC: We will give magnesium IV recheck magnesium tomorrow morning.   DVT prophylaxis: Heparin Code Status: full Family Communication: none Disposition Plan: hopefully in 4 days Consults called: none Admission status: inpatient   Charlynne Cousins MD [facility] Pager [phone]  If 7PM-7AM, please contact night-coverage [email or web address] Password [facility]  03/18/2018, 8:27 AM

## 2018-03-18 NOTE — ED Notes (Signed)
2 gram sodium diet lunch tray ordered 

## 2018-03-18 NOTE — Plan of Care (Addendum)
Discussed patient with Dr. Wilkie AyeHorton.  Mr. Robert Maynard is 84y/o male with a medical history significant for HTN, HLD, CHF, COPD on 2 L of oxygen, CKD stage III, and pancytopenia; who presents with complaints of shortness of breath and lower extremity swelling.  Seen by PCP and told to double up on Lasix from 20 to 40 mg twice daily 2 days ago, but patient notes no change in urine output.  Vital signs otherwise stable with O2 saturations maintained on home 2 L.  Labs revealed BNP 1234.4.  Patient was given 60 mg of Lasix IV in the emergency department.  Doxycycline was given for possible cellulitis of the lower extremity.  Admit as inpatient to a telemetry bed for presumed CHF exacerbation.

## 2018-03-19 DIAGNOSIS — I5041 Acute combined systolic (congestive) and diastolic (congestive) heart failure: Secondary | ICD-10-CM

## 2018-03-19 LAB — MAGNESIUM: Magnesium: 1.7 mg/dL (ref 1.7–2.4)

## 2018-03-19 LAB — BASIC METABOLIC PANEL
ANION GAP: 9 (ref 5–15)
BUN: 19 mg/dL (ref 6–20)
CHLORIDE: 98 mmol/L — AB (ref 101–111)
CO2: 33 mmol/L — AB (ref 22–32)
Calcium: 8.9 mg/dL (ref 8.9–10.3)
Creatinine, Ser: 1.2 mg/dL (ref 0.61–1.24)
GFR calc non Af Amer: 54 mL/min — ABNORMAL LOW (ref >60)
Glucose, Bld: 137 mg/dL — ABNORMAL HIGH (ref 65–99)
Potassium: 4.3 mmol/L (ref 3.5–5.1)
Sodium: 140 mmol/L (ref 135–145)

## 2018-03-19 MED ORDER — ADULT MULTIVITAMIN W/MINERALS CH
1.0000 | ORAL_TABLET | Freq: Every day | ORAL | Status: DC
  Administered 2018-03-19 – 2018-03-22 (×4): 1 via ORAL
  Filled 2018-03-19 (×4): qty 1

## 2018-03-19 NOTE — Progress Notes (Signed)
PROGRESS NOTE    Robert Maynard  VHQ:[id number] DOB: 12/18/1932 DOA: 03/17/2018 PCP: Homero FellersWilson, Ada K, FNP     Brief Narrative:  Robert Maynard Prom is a 84 y.o. male with medical history significant of past medical history of coronary artery disease, chronic kidney disease stage III, COPD dependent on 2 L of oxygen, asthma, essential hypertension who presents with lower extremity edema and shortness of breath. He saw his primary care doctor on 03/16/2017 who he double his dose of Lasix but as per chart his shortness of breath did not improve. He had orthopnea and his lower extremity edema did not improve. He was admitted for further treatment and evaluation of CHF exacerbation.  Assessment & Plan:   Principal Problem:   Acute combined systolic and diastolic congestive heart failure (HCC) Active Problems:   Cellulitis of left lower extremity   CKD (chronic kidney disease), stage III (HCC)   Acute pulmonary edema (HCC)   Acute combined systolic and diastolic CHF -Unable to locate any previous echocardiogram result and no previous diagnosis of CHF but has been on PO lasix 20mg  prn at home -BNP 1234.4 on admission  -CXR showed congestive changes with cardiac enlargement, pulmonary vascular congestion, interstitial edema, and bilateral pleural effusions -Echo EF 20-25%, grade 2 diastolic dysfunction  -IV lasix 60mg  BID  -Strict I/Os, daily weight. Has diuresed approx 2L so far  -Continue lisinopril -Will need beta blocker once BP stable to tolerate  -Ted hose  -Consult cardiology for new onset CHF with EF 20%  LLE cellulitis -Blood culture pending  -Continue doxycycline  CKD stage 3  -Baseline Cr 1.1-1.3  -Stable   CAD -Continue aspirin   Chronic hypoxemic respiratory failure -On 2L [place] O2 at baseline  COPD -Without acute exacerbation  -Continue nebs   GERD -Continue PPI    DVT prophylaxis: Subq hep Code Status: Full Family Communication: No family at bedside Disposition  Plan: Pending further diuresis and clinical improvement, Cardiology consulted, PT recommending SNF    Consultants:   Cardiology  Procedures:   None   Antimicrobials:  Anti-infectives (From admission, onward)   Start     Dose/Rate Route Frequency Ordered Stop   03/18/18 0445  doxycycline (VIBRA-TABS) tablet 100 mg     100 mg Oral  Once 03/18/18 0432 03/18/18 0546       Subjective: Patient states his breathing is better and edema improved as well   Objective: Vitals:   03/18/18 2349 03/19/18 0431 03/19/18 0501 03/19/18 1015  BP: 98/67  104/68 97/71  Pulse: 96  96 99  Resp:   18 18  Temp: 98.2 F (36.8 C)  98.1 F (36.7 C) 98.1 F (36.7 C)  TempSrc: Oral  Oral Oral  SpO2:  92% 96% 94%  Weight:   107.8 kg (237 lb 10.5 oz)   Height:        Intake/Output Summary (Last 24 hours) at 03/19/2018 1056 Last data filed at 03/19/2018 1019 Gross per 24 hour  Intake 480 ml  Output 2800 ml  Net -2320 ml   Filed Weights   03/18/18 1615 03/19/18 0501  Weight: 108.9 kg (240 lb) 107.8 kg (237 lb 10.5 oz)    Examination:  General exam: Appears calm and comfortable  ENT: mucosa dry  Respiratory system: Clear to auscultation. Respiratory effort normal. On [place] O2  Cardiovascular system: S1 & S2 heard, RRR. No JVD, murmurs, rubs, gallops or clicks. +2 pedal edema. Gastrointestinal system: Abdomen is nondistended, soft and nontender. No organomegaly  or masses felt. Normal bowel sounds heard. Central nervous system: Alert and oriented. No focal neurological deficits. Extremities: Symmetric 5 x 5 power. Psychiatry: Judgement and insight appear normal. Mood & affect appropriate.   Data Reviewed: I have personally reviewed following labs and imaging studies  CBC: Recent Labs  Lab 03/17/18 2138 03/18/18 1151  WBC 6.2 5.9  HGB 9.9* 10.0*  HCT 33.2* 33.2*  MCV 89.0 88.8  PLT 101* 94*   Basic Metabolic Panel: Recent Labs  Lab 03/17/18 2138 03/18/18 1151 03/19/18 0411  NA 139   --  140  K 3.7  --  4.3  CL 99*  --  98*  CO2 30  --  33*  GLUCOSE 151*  --  137*  BUN 21*  --  19  CREATININE 1.17 1.12 1.20  CALCIUM 8.7*  --  8.9  MG  --   --  1.7   GFR: Estimated Creatinine Clearance: 59.3 mL/min (by C-Maynard formula based on SCr of 1.2 mg/dL). Liver Function Tests: No results for input(s): AST, ALT, ALKPHOS, BILITOT, PROT, ALBUMIN in the last 168 hours. No results for input(s): LIPASE, AMYLASE in the last 168 hours. No results for input(s): AMMONIA in the last 168 hours. Coagulation Profile: No results for input(s): INR, PROTIME in the last 168 hours. Cardiac Enzymes: No results for input(s): CKTOTAL, CKMB, CKMBINDEX, TROPONINI in the last 168 hours. BNP (last 3 results) No results for input(s): PROBNP in the last 8760 hours. HbA1C: No results for input(s): HGBA1C in the last 72 hours. CBG: No results for input(s): GLUCAP in the last 168 hours. Lipid Profile: No results for input(s): CHOL, HDL, LDLCALC, TRIG, CHOLHDL, LDLDIRECT in the last 72 hours. Thyroid Function Tests: No results for input(s): TSH, T4TOTAL, FREET4, T3FREE, THYROIDAB in the last 72 hours. Anemia Panel: No results for input(s): VITAMINB12, FOLATE, FERRITIN, TIBC, IRON, RETICCTPCT in the last 72 hours. Sepsis Labs: No results for input(s): PROCALCITON, LATICACIDVEN in the last 168 hours.  No results found for this or any previous visit (from the past 240 hour(s)).     Radiology Studies: Dg Chest 2 View  Result Date: 03/17/2018 CLINICAL DATA:  Two week history of increased shortness of breath and bilateral lower extremity edema. EXAM: CHEST - 2 VIEW COMPARISON:  None. FINDINGS: Postoperative changes in the mediastinum. Fractures of 2 superior sternotomy wires. Diffuse cardiac enlargement. Mild pulmonary vascular congestion. Slight interstitial pattern in the lung bases likely due to edema. Small bilateral pleural effusions. No focal consolidation. No pneumothorax. Calcified and tortuous  aorta. Degenerative changes in the spine. IMPRESSION: 1. Congestive changes with cardiac enlargement, pulmonary vascular congestion, interstitial edema, and bilateral pleural effusions. 2. Aortic atherosclerosis. Electronically Signed   By: Burman Nieves M.D.   On: 03/17/2018 22:27      Scheduled Meds: . aspirin EC  325 mg Oral Daily  . doxazosin  4 mg Oral Daily  . furosemide  60 mg Intravenous Q12H  . heparin  5,000 Units Subcutaneous Q8H  . lisinopril  5 mg Oral Daily  . pantoprazole  40 mg Oral Daily  . sodium chloride flush  3 mL Intravenous Q12H  . umeclidinium-vilanterol  1 puff Inhalation Daily   Continuous Infusions: . sodium chloride       LOS: 1 day    Time spent: 40 minutes   Noralee Stain, DO [facility] [email or web address] Password [facility] 03/19/2018, 10:56 AM

## 2018-03-19 NOTE — Consult Note (Signed)
Reason for Consult: New diagnosis heart failure Referring Physician: [facility]  Robert Maynard is an 84 y.o. male.  HPI:   84 y/o Caucasian male with CAD s/p CABG (at least 3 aortocoronary graft markers seen on CXR, curiously records not available) many years ago, reported ?asthma on home oxygen, remote smoking history, morbid obesity, hypertension, hyperlipidemia, GERD, venous insufficiency. He lives with his 46 y/o wife. He has had worsening leg edema and shortness of breath for last several days. He also reports occasional left sided chest pain. Workup so far showed cardiomegaly, vascular congestion on chest Xray, BNP 1200, negative troponin. EKG and EF suggest old posterolateral infarct with BIV failure EF 20%, grade 2 DD, elevated LAP, moderate MR.  Past Medical History:  Diagnosis Date  . Acute pulmonary edema ([place]) 03/18/2018  . Asthma   . CHF (congestive heart failure) ([place])   . CKD (chronic kidney disease), stage III ([place])   . Hypertension   . MI (myocardial infarction) [facility])     Past Surgical History:  Procedure Laterality Date  . CARDIAC SURGERY      History reviewed. No pertinent family history.  Social History:  reports that he has quit smoking. His smoking use included cigarettes. He has a 60.00 pack-year smoking history. His smokeless tobacco use includes chew. He reports that he does not drink alcohol or use drugs.  Allergies:  Allergies  Allergen Reactions  . Penicillins Rash    Medications: I have reviewed the patient's current medications.  Results for orders placed or performed during the hospital encounter of 03/17/18 (from the past 48 hour(s))  Basic metabolic panel     Status: Abnormal   Collection Time: 03/17/18  9:38 PM  Result Value Ref Range   Sodium 139 135 - 145 mmol/L   Potassium 3.7 3.5 - 5.1 mmol/L   Chloride 99 (L) 101 - 111 mmol/L   CO2 30 22 - 32 mmol/L   Glucose, Bld 151 (H) 65 - 99 mg/dL   BUN 21 (H) 6 - 20 mg/dL    Creatinine, Ser 1.17 0.61 - 1.24 mg/dL   Calcium 8.7 (L) 8.9 - 10.3 mg/dL   GFR calc non Af Amer 56 (L) >60 mL/min   GFR calc Af Amer >60 >60 mL/min    Comment: (NOTE) The eGFR has been calculated using the CKD EPI equation. This calculation has not been validated in all clinical situations. eGFR's persistently <60 mL/min signify possible Chronic Kidney Disease.    Anion gap 10 5 - 15    Comment: Performed at [facility] [address], [place], [place] [address]  CBC     Status: Abnormal   Collection Time: 03/17/18  9:38 PM  Result Value Ref Range   WBC 6.2 4.0 - 10.5 K/uL   RBC 3.73 (L) 4.22 - 5.81 MIL/uL   Hemoglobin 9.9 (L) 13.0 - 17.0 g/dL   HCT 33.2 (L) 39.0 - 52.0 %   MCV 89.0 78.0 - 100.0 fL   MCH 26.5 26.0 - 34.0 pg   MCHC 29.8 (L) 30.0 - 36.0 g/dL   RDW 16.0 (H) 11.5 - 15.5 %   Platelets 101 (L) 150 - 400 K/uL    Comment: REPEATED TO VERIFY SPECIMEN CHECKED FOR CLOTS PLATELET COUNT CONFIRMED BY SMEAR Performed at [facility], [place] [address], [place],  [address]   Brain natriuretic peptide     Status: Abnormal   Collection Time: 03/17/18  9:38 PM  Result Value  Ref Range   B Natriuretic Peptide 1,234.4 (H) 0.0 - 100.0 pg/mL    Comment: Performed at [facility] [address], [place], [place] [address]  I-stat troponin, ED     Status: None   Collection Time: 03/17/18  9:50 PM  Result Value Ref Range   Troponin i, poc 0.04 0.00 - 0.08 ng/mL   Comment 3            Comment: Due to the release kinetics of cTnI, a negative result within the first hours of the onset of symptoms does not rule out myocardial infarction with certainty. If myocardial infarction is still suspected, repeat the test at appropriate intervals.   CBC     Status: Abnormal   Collection Time: 03/18/18 11:51 AM  Result Value Ref Range   WBC 5.9 4.0 - 10.5 K/uL   RBC 3.74 (L) 4.22 - 5.81 MIL/uL   Hemoglobin 10.0 (L) 13.0 - 17.0 g/dL   HCT 33.2 (L) 39.0 - 52.0 %    MCV 88.8 78.0 - 100.0 fL   MCH 26.7 26.0 - 34.0 pg   MCHC 30.1 30.0 - 36.0 g/dL   RDW 15.9 (H) 11.5 - 15.5 %   Platelets 94 (L) 150 - 400 K/uL    Comment: CONSISTENT WITH PREVIOUS RESULT Performed at [facility] [address], [place], [place] [address]   Creatinine, serum     Status: Abnormal   Collection Time: 03/18/18 11:51 AM  Result Value Ref Range   Creatinine, Ser 1.12 0.61 - 1.24 mg/dL   GFR calc non Af Amer 59 (L) >60 mL/min   GFR calc Af Amer >60 >60 mL/min    Comment: (NOTE) The eGFR has been calculated using the CKD EPI equation. This calculation has not been validated in all clinical situations. eGFR's persistently <60 mL/min signify possible Chronic Kidney Disease. Performed at [facility], [place] [address] [place], [place] [address]   Basic metabolic panel     Status: Abnormal   Collection Time: 03/19/18  4:11 AM  Result Value Ref Range   Sodium 140 135 - 145 mmol/L   Potassium 4.3 3.5 - 5.1 mmol/L   Chloride 98 (L) 101 - 111 mmol/L   CO2 33 (H) 22 - 32 mmol/L   Glucose, Bld 137 (H) 65 - 99 mg/dL   BUN 19 6 - 20 mg/dL   Creatinine, Ser 1.20 0.61 - 1.24 mg/dL   Calcium 8.9 8.9 - 10.3 mg/dL   GFR calc non Af Amer 54 (L) >60 mL/min   GFR calc Af Amer >60 >60 mL/min    Comment: (NOTE) The eGFR has been calculated using the CKD EPI equation. This calculation has not been validated in all clinical situations. eGFR's persistently <60 mL/min signify possible Chronic Kidney Disease.    Anion gap 9 5 - 15    Comment: Performed at [facility] [address] [place], [place] [address]  Magnesium     Status: None   Collection Time: 03/19/18  4:11 AM  Result Value Ref Range   Magnesium 1.7 1.7 - 2.4 mg/dL    Comment: Performed at [facility] [address], [place],  [address]    Dg Chest 2 View  Result Date: 03/17/2018 CLINICAL DATA:  Two week history of increased shortness of breath and bilateral lower extremity edema.  EXAM: CHEST - 2 VIEW COMPARISON:  None. FINDINGS: Postoperative changes in the mediastinum. Fractures of 2  superior sternotomy wires. Diffuse cardiac enlargement. Mild pulmonary vascular congestion. Slight interstitial pattern in the lung bases likely due to edema. Small bilateral pleural effusions. No focal consolidation. No pneumothorax. Calcified and tortuous aorta. Degenerative changes in the spine. IMPRESSION: 1. Congestive changes with cardiac enlargement, pulmonary vascular congestion, interstitial edema, and bilateral pleural effusions. 2. Aortic atherosclerosis. Electronically Signed   By: Lucienne Capers M.D.   On: 03/17/2018 22:27    EKG 03/17/2018: Sinus rhythm 98 bpm. IVCD. Old inferoposterior and anterolateral infarct. PAC's    Echocardiogram 03/18/2018 Study Conclusions  - Left ventricle: The cavity size was severely dilated. Wall   thickness was increased in a pattern of mild LVH. Systolic   function was severely reduced. The estimated ejection fraction   was in the range of 20% to 25%. Diffuse hypokinesis. There is   akinesis of the inferolateral and inferior myocardium. Features   are consistent with a pseudonormal left ventricular filling   pattern, with concomitant abnormal relaxation and increased   filling pressure (grade 2 diastolic dysfunction). Doppler   parameters are consistent with high ventricular filling pressure. - Mitral valve: Calcified annulus. There was moderate   regurgitation. - Left atrium: The atrium was moderately dilated. - Right ventricle: The cavity size was moderately dilated. Systolic   function was severely reduced. - Right atrium: The atrium was moderately dilated. - Pulmonary arteries: Systolic pressure was moderately increased.   PA peak pressure: 50 mm Hg (S).  Impressions:  - Diffuse hypokinesis with akinesis of the inferior and   inferolateral walls; overall severely reduced LV function;   moderate diastolic dysfunction; mild  LVH; no apical thrombus   noted using definity; 4 chamber enlargement; moderate MR; mild MR   and TR; severe RV dysfunction; moderate pulmonary hypertension.  Review of Systems  Constitutional: Positive for malaise/fatigue.  Eyes: Negative.   Respiratory: Positive for cough and shortness of breath.   Cardiovascular: Positive for chest pain (Occasional. Currently absent), orthopnea and leg swelling. Negative for palpitations and PND.  Gastrointestinal: Negative for abdominal pain and diarrhea.  Genitourinary:       Catheter in place  Musculoskeletal: Negative.   Skin: Negative.   Neurological: Negative for loss of consciousness.  Endo/Heme/Allergies: Negative for environmental allergies.  Psychiatric/Behavioral: The patient is nervous/anxious.   All other systems reviewed and are negative.  Blood pressure 97/71, pulse 99, temperature 98.1 F (36.7 C), temperature source Oral, resp. rate 18, height _0  (1.803 m), weight 107.8 kg (237 lb 10.5 oz), SpO2 94 %. Physical Exam  Vitals reviewed. Constitutional: He is oriented to person, place, and time. He appears well-developed.  Morbidly obese  HENT:  Head: Normocephalic and atraumatic.  Right Ear: External ear normal.  Neck: Normal range of motion. Neck supple. JVD present.  Cardiovascular: Normal rate.  Murmur (II/VI holosystoic murmur mitral area) heard. Intact distal pulses  Respiratory: Effort normal and breath sounds normal. He has no wheezes. He has no rales.  GI: Soft. Bowel sounds are normal. There is no tenderness.  Musculoskeletal: He exhibits edema (3+ b/l pitting).  Lymphadenopathy:    He has no cervical adenopathy.  Neurological: He is alert and oriented to person, place, and time. No cranial nerve deficit.  Skin: Skin is warm.  Psychiatric: His behavior is normal.    Assessment:  84 y/o Caucasian male Acute on chronic systolic and diastolic heart failure Ischemic cardiomyopathy CAD s/p CABG Morbid  obesity H/o hypertension Hyperlipidemia Anemia, thrombocytopenia H/p ? Asthma on home O2. I wonder  if this had been heart failure   Recommendations: Agree with IV diuresis 60 mg bid at this time. Low normal with acute decompensation may not allow for use of ACEi/BB at this time. Would attempt introducing short acting captopril 6.25 mg tid in a day or two. He likely has advanced stage ischemic cardiomyopathy. Long term prognosis is poor. He is not interested in any invasive workup given his advanced age and comorbidities. Recommend conservative medical therapy and outpatient follow up. He may need facility placement given limited support at home. Strict I/O Daily weight checks Sodium 2 g diet Keep K around 4, Mag around 2.  Eniya Cannady J Virginie Josten 03/19/2018, 11:59 AM    [place], MD [facility] Pager: [phone] Office: [phone] If no answer Cell [phone]

## 2018-03-19 NOTE — Progress Notes (Signed)
Initial Nutrition Assessment  DOCUMENTATION CODES:   Obesity unspecified  INTERVENTION:   -MVI daily  NUTRITION DIAGNOSIS:   Increased nutrient needs related to wound healing as evidenced by estimated needs.  GOAL:   Patient will meet greater than or equal to 90% of their needs  MONITOR:   PO intake, Supplement acceptance, Labs, Weight trends, Skin, I & O's  REASON FOR ASSESSMENT:   Low [place]    ASSESSMENT:   Robert Maynard is a 84 y.o. male with medical history significant of past medical history of coronary artery disease, chronic kidney disease stage II, COPD dependent on 2 L of oxygen, asthma essential hypertension who is a poor historian so most of the history is obtained from the chart who comes in for lower extremity edema and shortness of breath   Pt admitted with CHF.   Case discussed with RN prior to visit. Pt admitted from home where he lives with his wife; he is refusing SNF placement. Per RN, pt is oriented and will respond appropriately to simple questions, however, can be difficult to engage at times.    Spoke with pt, who was sitting in recliner chair at time of visit. He reports that he just ambulated the hallways and feels tired. Pt endorses good appetite at home; he consumes 3 meals per day (Breakfast: bacon, eggs, and sausage, Lunch: Pinot beans and green beans, Dinner: cauliflower, broccoli, and beans). Pt denies seasoning his food, but does add butter to vegetables. He admits to consuming meat occasionally and denies difficulty chewing despite lack of teeth. He and his granddaughter are responsible for meal preparation. He also has two daughter, who live locally, that assist with care.   Pt reports he consumed eggs, fruit, and coffee this AM (did not eat grits due to preference). Meal completion 85%.   Pt endorses wt loss. Reviewed CareEverywhere records; noted wt of 249# on 07/21/17. No wt changes significant for time frame. Suspect wt changes may be  related to fluid changes from CHF.   Discussed importance of good meal and supplement intake to promote healing.   Labs reviewed.   NUTRITION - FOCUSED PHYSICAL EXAM:    Most Recent Value  Orbital Region  No depletion  Upper Arm Region  No depletion  Thoracic and Lumbar Region  No depletion  Buccal Region  No depletion  Temple Region  No depletion  Clavicle Bone Region  No depletion  Clavicle and Acromion Bone Region  No depletion  Scapular Bone Region  No depletion  Dorsal Hand  No depletion  Patellar Region  No depletion  Anterior Thigh Region  No depletion  Posterior Calf Region  No depletion  Edema (RD Assessment)  Moderate  Hair  Reviewed  Eyes  Reviewed  Mouth  Reviewed  Skin  Reviewed  Nails  Reviewed       Diet Order:  Diet 2 gram sodium Room service appropriate? Yes; Fluid consistency: Thin; Fluid restriction: 1200 mL Fluid  EDUCATION NEEDS:   Education needs have been addressed  Skin:  Skin Assessment: Skin Integrity Issues: Skin Integrity Issues:: Other (Comment) Other: non-presure wound to lt scrotum  Last BM:  PTA  Height:   Ht Readings from Last 1 Encounters:  03/18/18 5\' 11"  (1.803 m)    Weight:   Wt Readings from Last 1 Encounters:  03/19/18 237 lb 10.5 oz (107.8 kg)    Ideal Body Weight:  78.2 kg  BMI:  Body mass index is 33.15 kg/m.  Estimated Nutritional  Needs:   Kcal:  1750-1950  Protein:  90-105 grams  Fluid:  1.2 L    Robert Maynard KnifeWilliams, RD, LDN, CDE Pager: [phone] After hours Pager: [phone]

## 2018-03-19 NOTE — Evaluation (Addendum)
Physical Therapy Evaluation Patient Details Name: Robert Maynard MRN: [id number] DOB: 09/13/1933 Today's Date: 03/19/2018   History of Present Illness  Pt is an 84 y.o. male admitted 03/17/18 for SOB and LE edema; worked up for CHF exacerbation. PMH includes CAD< CKD II, COPD (2L home O2), asthma.     Clinical Impression  Pt presents with an overall decrease in functional mobility secondary to above. PTA, pt lives with wife; reports mod indep with ADLs and amb short distances using rollator. Unsure pt reliable historian. Per RN, pt's [facility] Today, pt amb 20' with RW and close min guard for balance. DOE 3/4; SpO2 down to 80% on 2L O2 [place], returning to 90% with seated rest and deep breathing. Pt has poor safety awareness and insight into deficits. Discussed recommendation for short-term SNF-level therapies to maximize functional mobility and independence prior to return home; pt declining. May benefit from long-term care placement. Will follow acutely to address established goals.   Follow Up Recommendations SNF;Supervision/Assistance - 24 hour(refusing SNF; will need HH services & aide, maybe LTC?)    Equipment Recommendations  None recommended by PT    Recommendations for Other Services       Precautions / Restrictions Precautions Precautions: Fall Restrictions Weight Bearing Restrictions: No      Mobility  Bed Mobility               General bed mobility comments: Received sitting EOB  Transfers Overall transfer level: Needs assistance Equipment used: Rolling walker (2 wheeled) Transfers: Sit to/from Stand Sit to Stand: Min guard         General transfer comment: Able to stand on 2nd attempt with RW and min guard for balance; reliant on momentum to power into standing  Ambulation/Gait Ambulation/Gait assistance: Min guard Ambulation Distance (Feet): 20 Feet Assistive device: Rolling walker (2 wheeled) Gait Pattern/deviations:  Step-through pattern;Decreased stride length Gait velocity: Decreased Gait velocity interpretation: <1.31 ft/sec, indicative of household ambulator General Gait Details: Amb in room with RW and min guard for balance; DOE 3/4 with this. SpO2 down to 80% on 2L O2 [place], returning to 90% with seated rest and deep breathing  Stairs            Wheelchair Mobility    Modified Rankin (Stroke Patients Only)       Balance Overall balance assessment: Needs assistance Sitting-balance support: Feet supported;No upper extremity supported Sitting balance-Leahy Scale: Fair       Standing balance-Leahy Scale: Poor Standing balance comment: Reliant on UE support                             Pertinent Vitals/Pain Pain Assessment: No/denies pain    Home Living Family/patient expects to be discharged to:: Private residence Living Arrangements: Spouse/significant other Available Help at Discharge: Family;Home health;Available PRN/intermittently Type of Home: House Home Access: Stairs to enter   Entergy CorporationEntrance Stairs-Number of Steps: "a couple" Home Layout: One level Home Equipment: Environmental consultantWalker - 4 wheels;Bedside commode;Shower seat;Wheelchair - manual Additional Comments: Pt poor historian. Reports aide (sounds like HHRN?) assists every other day. Planning to have an aide start coming daily    Prior Function Level of Independence: Needs assistance   Gait / Transfers Assistance Needed: Reports mod indep for short household distances with rollator  ADL's / Homemaking Assistance Needed: Reports indep with ADLs  Comments: Has aide [facility]?) assist every other day with medications  Hand Dominance        Extremity/Trunk Assessment   Upper Extremity Assessment Upper Extremity Assessment: Generalized weakness    Lower Extremity Assessment Lower Extremity Assessment: Generalized weakness       Communication   Communication: HOH  Cognition Arousal/Alertness:  Awake/alert Behavior During Therapy: WFL for tasks assessed/performed Overall Cognitive Status: No family/caregiver present to determine baseline cognitive functioning Area of Impairment: Memory;Following commands;Problem solving;Safety/judgement                     Memory: Decreased short-term memory Following Commands: Follows one step commands with increased time Safety/Judgement: Decreased awareness of safety;Decreased awareness of deficits   Problem Solving: Slow processing;Requires verbal cues General Comments: Most likely baseline cognition. Poor insight into current condition/deficits and safety       General Comments      Exercises     Assessment/Plan    PT Assessment Patient needs continued PT services  PT Problem List Decreased strength;Decreased activity tolerance;Decreased balance;Decreased mobility;Decreased knowledge of use of DME;Decreased safety awareness       PT Treatment Interventions DME instruction;Gait training;Stair training;Functional mobility training;Therapeutic activities;Therapeutic exercise;Balance training;Patient/family education    PT Goals (Current goals can be found in the Care Plan section)  Acute Rehab PT Goals Patient Stated Goal: Return home PT Goal Formulation: With patient Time For Goal Achievement: 04/02/18 Potential to Achieve Goals: Good    Frequency Min 2X/week   Barriers to discharge Decreased caregiver support      Co-evaluation               AM-PAC PT "6 Clicks" Daily Activity  Outcome Measure Difficulty turning over in bed (including adjusting bedclothes, sheets and blankets)?: A Little Difficulty moving from lying on back to sitting on the side of the bed? : A Little Difficulty sitting down on and standing up from a chair with arms (e.g., wheelchair, bedside commode, etc,.)?: Unable Help needed moving to and from a bed to chair (including a wheelchair)?: A Little Help needed walking in hospital room?: A  Little Help needed climbing 3-5 steps with a railing? : A Lot 6 Click Score: 15    End of Session Equipment Utilized During Treatment: Gait belt Activity Tolerance: Patient limited by fatigue Patient left: in chair;with call bell/phone within reach;with chair alarm set Nurse Communication: Mobility status PT Visit Diagnosis: Other abnormalities of gait and mobility (R26.89)    Time: 1610-9604 PT Time Calculation (min) (ACUTE ONLY): 21 min   Charges:   PT Evaluation $PT Eval Moderate Complexity: 1 Mod     PT G Codes:       Ina Homes, PT, DPT Acute Rehab Services  Pager: [phone]  Malachy Chamber 03/19/2018, 9:46 AM

## 2018-03-19 NOTE — Clinical Social Work Note (Signed)
Clinical Social Work Assessment  Patient Details  Name: Robert Maynard MRN: [id number] Date of Birth: 12-12-1932  Date of referral:  03/19/18               Reason for consult:  Facility Placement, Discharge Planning                Permission sought to share information with:  Family Supports Permission granted to share information::  Yes, Verbal Permission Granted  Name::     Arlyce Dice  Agency::     Relationship::  spouse  Contact Information:  [phone]  Housing/Transportation Living arrangements for the past 2 months:  Single Family Home Source of Information:  Patient Patient Interpreter Needed:  None Criminal Activity/Legal Involvement Pertinent to Current Situation/Hospitalization:  No - Comment as needed Significant Relationships:  Adult Children, Spouse Lives with:  Spouse Do you feel safe going back to the place where you live?  Yes Need for family participation in patient care:  No (Coment)  Care giving concerns: Patient from home with wife. PT recommending SNF.   Social Worker assessment / plan: CSW met with patient at bedside. Patient alert and oriented, though only giving very brief answers. CSW discussed recommendation for SNF and assessed patient's ability to manage independently at home. Patient refused SNF, stating he uses a walker at home and indicated he is able to manage ADLs on his own. Patient gave permission to call his wife, Robert Maynard.  CSW did call Robert Maynard, but was unable to reach her; phone had a busy signal x2. CSW will refer to [facility] for home health needs. Please re-consult social work if plan changes and patient agreeable to SNF.  Employment status:  Retired Archivist) PT Recommendations:  [place] / Referral to community resources:  [place]  Patient/Family's Response to care: Patient did not discuss.  Patient/Family's Understanding of and Emotional Response  to Diagnosis, Current Treatment, and Prognosis: Patient acknowledged recommendation for SNF but is refusing SNF at this time.  Emotional Assessment Appearance:  Appears stated age Attitude/Demeanor/Rapport:  Engaged Affect (typically observed):  Calm Orientation:  Oriented to Self, Oriented to Place, Oriented to  Time, Oriented to Situation Alcohol / Substance use:  Not Applicable Psych involvement (Current and /or in the community):  No (Comment)  Discharge Needs  Concerns to be addressed:  Discharge Planning Concerns, Care Coordination Readmission within the last 30 days:  No Current discharge risk:  Physical Impairment Barriers to Discharge:  Continued Medical Work up   Estanislado Emms, LCSW 03/19/2018, 2:13 PM

## 2018-03-19 NOTE — Care Management Note (Signed)
Case Management Note  Patient Details  Name: Robert Maynard MRN: [id number] Date of Birth: 11/02/1933  Subjective/Objective:    CHF               Action/Plan: Patient lives at home with spouse; PCP: Homero FellersWilson, Ada K, FNP; has private insurance with [employer] with prescription drug coverage; noted Physical Therapy recommendation for SNF placement; CM will conitnue to follow for progression of care  Expected Discharge Date:    possibly 03/22/2018              Expected Discharge Plan:  Skilled Nursing Facility  Discharge planning Services  CM Consult  Status of Service:  In process, will continue to follow  Reola MosherChandler, Sissi Padia L, RN,MHA,BSN [phone] 03/19/2018, 12:05 PM

## 2018-03-20 LAB — BASIC METABOLIC PANEL
ANION GAP: 12 (ref 5–15)
BUN: 19 mg/dL (ref 6–20)
CALCIUM: 9.2 mg/dL (ref 8.9–10.3)
CO2: 33 mmol/L — AB (ref 22–32)
Chloride: 95 mmol/L — ABNORMAL LOW (ref 101–111)
Creatinine, Ser: 1.25 mg/dL — ABNORMAL HIGH (ref 0.61–1.24)
GFR calc Af Amer: >60 mL/min (ref >60)
GFR calc non Af Amer: 52 mL/min — ABNORMAL LOW (ref >60)
Glucose, Bld: 128 mg/dL — ABNORMAL HIGH (ref 65–99)
Potassium: 4 mmol/L (ref 3.5–5.1)
Sodium: 140 mmol/L (ref 135–145)

## 2018-03-20 LAB — CBC
HCT: 32.3 % — ABNORMAL LOW (ref 39.0–52.0)
HEMOGLOBIN: 9.4 g/dL — AB (ref 13.0–17.0)
MCH: 25.9 pg — AB (ref 26.0–34.0)
MCHC: 29.1 g/dL — AB (ref 30.0–36.0)
MCV: 89 fL (ref 78.0–100.0)
Platelets: 115 10*3/uL — ABNORMAL LOW (ref 150–400)
RBC: 3.63 MIL/uL — ABNORMAL LOW (ref 4.22–5.81)
RDW: 15.9 % — AB (ref 11.5–15.5)
WBC: 5.3 10*3/uL (ref 4.0–10.5)

## 2018-03-20 NOTE — Care Management Note (Signed)
Case Management Note  Patient Details  Name: Robert Maynard MRN: [id number] Date of Birth: 11/21/1933  Subjective/Objective:   CHF                Action/Plan: Patient lives at home with spouse; PCP: Homero FellersWilson, Ada K, FNP; has private insurance with [employer] with prescription drug coverage; patient is refusing SNF placement and request to return home at discharge [facility]; [facility]; [facility]; CM will continue to follow for progression of care.   Expected Discharge Date:    possibly 03/22/2018              Expected Discharge Plan:  Home with HHC  Discharge planning Services  CM Consult  Choice offered to:  Spouse  HH Arranged:  RN, Disease Management, PT, Nurse's Aide, Social Work [employer] Agency:  [facility]  Status of Service:  In process, will continue to follow  Reola MosherChandler, Fatumata Kashani L, RN,MHA,BSN [phone] 03/20/2018, 10:25 AM

## 2018-03-20 NOTE — Progress Notes (Signed)
   03/20/18 0205  Respiratory  Bilateral Breath Sounds Expiratory wheezes  R Upper  Breath Sounds Expiratory wheezes  L Upper Breath Sounds Expiratory wheezes  R Lower Breath Sounds Diminished  L Lower Breath Sounds Diminished  Pt c/o shortness of breath, prn albuterol neb given, pt verbalized feeling better after the treatment. Will continue to monitor.

## 2018-03-20 NOTE — Progress Notes (Signed)
PROGRESS NOTE    Robert Maynard  ZOX:[id number] DOB: December 27, 1932 DOA: 03/17/2018 PCP: Homero Fellers, FNP     Brief Narrative:  Robert Maynard is a 84 y.o. male with medical history significant of past medical history of coronary artery disease, chronic kidney disease stage III, COPD dependent on 2 L of oxygen, asthma, essential hypertension who presents with lower extremity edema and shortness of breath. He saw his primary care doctor on 03/16/2017 who he double his dose of Lasix but as per chart his shortness of breath did not improve. He had orthopnea and his lower extremity edema did not improve. He was admitted for further treatment and evaluation of CHF exacerbation.  Assessment & Plan:   Principal Problem:   Acute combined systolic and diastolic congestive heart failure (HCC) Active Problems:   Cellulitis of left lower extremity   CKD (chronic kidney disease), stage III (HCC)   Acute pulmonary edema (HCC)   Acute combined systolic and diastolic CHF -Unable to locate any previous echocardiogram result and no previous diagnosis of CHF but has been on PO lasix 20mg  prn at home -BNP 1234.4 on admission  -CXR showed congestive changes with cardiac enlargement, pulmonary vascular congestion, interstitial edema, and bilateral pleural effusions -Echo EF 20-25%, grade 2 diastolic dysfunction  -IV lasix 60mg  BID  -Strict I/Os, daily weight. Has diuresed approx 3L so far  -Continue lisinopril -Will need beta blocker once BP stable to tolerate  -Ted hose  -Cardiology consulted, conservative medical therapy for now   LLE cellulitis -Blood culture negative to date  -Continue doxycycline  CKD stage 3  -Baseline Cr 1.1-1.3  -Stable   CAD -Continue aspirin   Chronic hypoxemic respiratory failure -On 2L [place] O2 at baseline  COPD -Without acute exacerbation  -Continue nebs   GERD -Continue PPI    DVT prophylaxis: Subq hep Code Status: Full Family Communication: No family at  bedside Disposition Plan: Pending further IV diuresis and clinical improvement, Patient refusing SNF and will go home with home health once stable    Consultants:   Cardiology  Procedures:   None   Antimicrobials:  Anti-infectives (From admission, onward)   Start     Dose/Rate Route Frequency Ordered Stop   03/18/18 0445  doxycycline (VIBRA-TABS) tablet 100 mg     100 mg Oral  Once 03/18/18 0432 03/18/18 0546       Subjective: Wants to go home. No new complaints of chest pain or dyspnea   Objective: Vitals:   03/19/18 1913 03/20/18 0612 03/20/18 0812 03/20/18 0945  BP: (!) 96/57 108/77 108/78   Pulse: 97 94 95   Resp: 18 18 (!) 28   Temp: 97.6 F (36.4 C) 98 F (36.7 C) 98.6 F (37 C)   TempSrc: Oral Oral Oral   SpO2: 97% 98% 93% 94%  Weight:  107.8 kg (237 lb 10.5 oz)    Height:        Intake/Output Summary (Last 24 hours) at 03/20/2018 1134 Last data filed at 03/20/2018 0953 Gross per 24 hour  Intake 840 ml  Output 1320 ml  Net -480 ml   Filed Weights   03/18/18 1615 03/19/18 0501 03/20/18 0612  Weight: 108.9 kg (240 lb) 107.8 kg (237 lb 10.5 oz) 107.8 kg (237 lb 10.5 oz)    Examination: General exam: Appears calm and comfortable  Respiratory system: +Diminished sounds. Respiratory effort increased with shallow tachypnea without accessory muscle use. On [place] O2. Cardiovascular system: S1 & S2 heard, tachycardic  rate 100s, regular rhythm. No JVD, murmurs, rubs, gallops or clicks. +bilateral pedal edema. Gastrointestinal system: Abdomen is nondistended, soft and nontender. No organomegaly or masses felt. Normal bowel sounds heard. Central nervous system: Alert and oriented. No focal neurological deficits. Extremities: Symmetric 5 x 5 power. Skin: +LLE shin with erythema without drainage  Psychiatry: Judgement and insight appear normal. Mood & affect appropriate.    Data Reviewed: I have personally reviewed following labs and imaging studies  CBC: Recent  Labs  Lab 03/17/18 2138 03/18/18 1151 03/20/18 0602  WBC 6.2 5.9 5.3  HGB 9.9* 10.0* 9.4*  HCT 33.2* 33.2* 32.3*  MCV 89.0 88.8 89.0  PLT 101* 94* 115*   Basic Metabolic Panel: Recent Labs  Lab 03/17/18 2138 03/18/18 1151 03/19/18 0411 03/20/18 0602  NA 139  --  140 140  K 3.7  --  4.3 4.0  CL 99*  --  98* 95*  CO2 30  --  33* 33*  GLUCOSE 151*  --  137* 128*  BUN 21*  --  19 19  CREATININE 1.17 1.12 1.20 1.25*  CALCIUM 8.7*  --  8.9 9.2  MG  --   --  1.7  --    GFR: Estimated Creatinine Clearance: 56.9 mL/min (A) (by C-G formula based on SCr of 1.25 mg/dL (H)). Liver Function Tests: No results for input(s): AST, ALT, ALKPHOS, BILITOT, PROT, ALBUMIN in the last 168 hours. No results for input(s): LIPASE, AMYLASE in the last 168 hours. No results for input(s): AMMONIA in the last 168 hours. Coagulation Profile: No results for input(s): INR, PROTIME in the last 168 hours. Cardiac Enzymes: No results for input(s): CKTOTAL, CKMB, CKMBINDEX, TROPONINI in the last 168 hours. BNP (last 3 results) No results for input(s): PROBNP in the last 8760 hours. HbA1C: No results for input(s): HGBA1C in the last 72 hours. CBG: No results for input(s): GLUCAP in the last 168 hours. Lipid Profile: No results for input(s): CHOL, HDL, LDLCALC, TRIG, CHOLHDL, LDLDIRECT in the last 72 hours. Thyroid Function Tests: No results for input(s): TSH, T4TOTAL, FREET4, T3FREE, THYROIDAB in the last 72 hours. Anemia Panel: No results for input(s): VITAMINB12, FOLATE, FERRITIN, TIBC, IRON, RETICCTPCT in the last 72 hours. Sepsis Labs: No results for input(s): PROCALCITON, LATICACIDVEN in the last 168 hours.  Recent Results (from the past 240 hour(s))  Culture, blood (Routine X 2) w Reflex to ID Panel     Status: None (Preliminary result)   Collection Time: 03/18/18 11:50 AM  Result Value Ref Range Status   Specimen Description BLOOD RIGHT HAND  Final   Special Requests   Final    BOTTLES  DRAWN AEROBIC AND ANAEROBIC Blood Culture adequate volume   Culture   Final    NO GROWTH 2 DAYS Performed at [facility], [address] [place], [place] [address]    Report Status PENDING  Incomplete  Culture, blood (Routine X 2) w Reflex to ID Panel     Status: None (Preliminary result)   Collection Time: 03/18/18 12:06 PM  Result Value Ref Range Status   Specimen Description BLOOD LEFT ANTECUBITAL  Final   Special Requests   Final    BOTTLES DRAWN AEROBIC AND ANAEROBIC Blood Culture adequate volume   Culture   Final    NO GROWTH 2 DAYS Performed at [facility], [address], [place], [place] [address]    Report Status PENDING  Incomplete       Radiology Studies: No  results found.    Scheduled Meds: . aspirin EC  325 mg Oral Daily  . doxazosin  4 mg Oral Daily  . furosemide  60 mg Intravenous Q12H  . heparin  5,000 Units Subcutaneous Q8H  . lisinopril  5 mg Oral Daily  . multivitamin with minerals  1 tablet Oral Daily  . pantoprazole  40 mg Oral Daily  . sodium chloride flush  3 mL Intravenous Q12H  . umeclidinium-vilanterol  1 puff Inhalation Daily   Continuous Infusions: . sodium chloride       LOS: 2 days    Time spent: 25 minutes   Noralee StainJennifer Kenyetta Fife, DO [facility] [email or web address] Password [email or web address] 03/20/2018, 11:34 AM

## 2018-03-20 NOTE — Consult Note (Signed)
   [facility] Inpatient Consult   03/20/2018  [place] 1932/12/18 [id number]   Patient screened for potential [place] [facility] services. Patient is in the [place] [facility] the [place] [facility] services under patient's [employer] plan.  Met with the patient at the bedside.  Patient is short of breath.  Patient states that he is waiting on his brother to visit who is a cancer survivor.  He states he and his wife live together and have a maid.  His daughter helps him make decisions.  He prefers to go home and realizes that therapy is recommending a skilled facility.  He states he hope to be better in a couple of days.  He is not sure of his disposition but hope to go home.  A brochure with [facility] and 24 hour nurse advise line was given.  Likely to return home with home health as per inpatient RNCM. No current community needs assessed.  For questions contact:   Natividad Brood, RN BSN [facility] Liaison  [phone] business mobile phone Toll free office [phone]

## 2018-03-20 NOTE — Evaluation (Signed)
Occupational Therapy Evaluation Patient Details Name: Robert Maynard MRN: [id number] DOB: 1933/03/03 Today's Date: 03/20/2018    History of Present Illness Pt is an 84 y.o. male admitted 03/17/18 for SOB and LE edema; worked up for CHF exacerbation. PMH includes CAD< CKD II, COPD (2L home O2), asthma.    Clinical Impression   Patient to be followed by skilled OT to maximize I and safety with ADL, and mobility. Patient is an agreement with acute OT followed by HHOT.     Follow Up Recommendations  Home health OT    Equipment Recommendations  None recommended by OT    Recommendations for Other Services       Precautions / Restrictions Precautions Precautions: Fall Restrictions Weight Bearing Restrictions: No      Mobility Bed Mobility Overal bed mobility: Needs Assistance Bed Mobility: Supine to Sit     Supine to sit: Min assist        Transfers       Sit to Stand: Min assist              Balance                                           ADL either performed or assessed with clinical judgement   ADL Overall ADL's : Needs assistance/impaired Eating/Feeding: Independent   Grooming: Wash/dry hands;Wash/dry face;Supervision/safety;Set up;Sitting   Upper Body Bathing: Supervision/ safety;Set up;Sitting   Lower Body Bathing: Minimal assistance;Moderate assistance   Upper Body Dressing : Minimal assistance   Lower Body Dressing: Moderate assistance;Sit to/from stand   Toilet Transfer: Minimal assistance   Toileting- Clothing Manipulation and Hygiene: Minimal assistance       Functional mobility during ADLs: Minimal assistance       Vision Baseline Vision/History: No visual deficits Patient Visual Report: (patient reports b cataract sx with lens implants)       Perception     Praxis      Pertinent Vitals/Pain Pain Assessment: No/denies pain     Hand Dominance Right   Extremity/Trunk Assessment Upper Extremity  Assessment Upper Extremity Assessment: Generalized weakness           Communication Communication Communication: No difficulties   Cognition Arousal/Alertness: Lethargic Behavior During Therapy: WFL for tasks assessed/performed Overall Cognitive Status: No family/caregiver present to determine baseline cognitive functioning                       Memory: Decreased short-term memory Following Commands: Follows one step commands consistently           General Comments       Exercises     Shoulder Instructions      Home Living Family/patient expects to be discharged to:: Private residence Living Arrangements: Spouse/significant other Available Help at Discharge: Family;Home health;Available PRN/intermittently Type of Home: House Home Access: Stairs to enter [employer] of Steps: "a couple"   Home Layout: One level     Bathroom Shower/Tub: Producer, television/film/video: Handicapped height     Home Equipment: Environmental consultant - 4 wheels;Bedside commode;Shower seat;Wheelchair - manual;Grab bars - tub/shower   Additional Comments: Pt poor historian. Reports aide (sounds like HHRN?) assists every other day. Planning to have an aide start coming daily      Prior Functioning/Environment      ADL's / Homemaking Assistance Needed: (patient states he  was mod i with adls.)   Comments: patinet states he amb with rolllator        OT Problem List:        OT Treatment/Interventions:      OT Goals(Current goals can be found in the care plan section) Acute Rehab OT Goals Patient Stated Goal: go home OT Goal Formulation: With patient Time For Goal Achievement: 04/03/18 Potential to Achieve Goals: Good  OT Frequency:     Barriers to D/C:            Co-evaluation              AM-PAC PT "6 Clicks" Daily Activity     Outcome Measure Help from another person eating meals?: None Help from another person taking care of personal grooming?: A  Little Help from another person toileting, which includes using toliet, bedpan, or urinal?: A Little Help from another person bathing (including washing, rinsing, drying)?: A Lot Help from another person to put on and taking off regular upper body clothing?: A Little Help from another person to put on and taking off regular lower body clothing?: A Lot 6 Click Score: 17   End of Session Equipment Utilized During Treatment: Gait belt;Rolling walker;Oxygen Nurse Communication: (ok therapy)  Activity Tolerance: Patient tolerated treatment well;Patient limited by fatigue Patient left: in chair;with call bell/phone within reach;with chair alarm set                   Time: [id number] OT Time Calculation (min): 46 min Charges:  OT General Charges $OT Visit: 1 Visit OT Evaluation $OT Eval Low Complexity: 1 Low OT Treatments $Self Care/Home Management : 23-37 mins G-Codes:    6 clicks  Wanell Lorenzi 03/20/2018, 11:50 AM

## 2018-03-21 LAB — BASIC METABOLIC PANEL
ANION GAP: 9 (ref 5–15)
BUN: 19 mg/dL (ref 6–20)
CHLORIDE: 92 mmol/L — AB (ref 101–111)
CO2: 36 mmol/L — AB (ref 22–32)
Calcium: 8.8 mg/dL — ABNORMAL LOW (ref 8.9–10.3)
Creatinine, Ser: 1.29 mg/dL — ABNORMAL HIGH (ref 0.61–1.24)
GFR calc Af Amer: 58 mL/min — ABNORMAL LOW (ref >60)
GFR calc non Af Amer: 50 mL/min — ABNORMAL LOW (ref >60)
GLUCOSE: 123 mg/dL — AB (ref 65–99)
POTASSIUM: 4.2 mmol/L (ref 3.5–5.1)
Sodium: 137 mmol/L (ref 135–145)

## 2018-03-21 LAB — CBC
HEMATOCRIT: 32.7 % — AB (ref 39.0–52.0)
HEMOGLOBIN: 9.7 g/dL — AB (ref 13.0–17.0)
MCH: 26.4 pg (ref 26.0–34.0)
MCHC: 29.7 g/dL — AB (ref 30.0–36.0)
MCV: 88.9 fL (ref 78.0–100.0)
Platelets: 111 10*3/uL — ABNORMAL LOW (ref 150–400)
RBC: 3.68 MIL/uL — ABNORMAL LOW (ref 4.22–5.81)
RDW: 16.4 % — ABNORMAL HIGH (ref 11.5–15.5)
WBC: 6.4 10*3/uL (ref 4.0–10.5)

## 2018-03-21 LAB — MAGNESIUM: Magnesium: 1.7 mg/dL (ref 1.7–2.4)

## 2018-03-21 MED ORDER — CARVEDILOL 3.125 MG PO TABS
3.1250 mg | ORAL_TABLET | Freq: Two times a day (BID) | ORAL | Status: DC
  Administered 2018-03-21 – 2018-03-22 (×2): 3.125 mg via ORAL
  Filled 2018-03-21 (×2): qty 1

## 2018-03-21 MED ORDER — DOXYCYCLINE HYCLATE 100 MG PO TABS
100.0000 mg | ORAL_TABLET | Freq: Two times a day (BID) | ORAL | Status: DC
  Administered 2018-03-21 – 2018-03-22 (×3): 100 mg via ORAL
  Filled 2018-03-21 (×3): qty 1

## 2018-03-21 MED ORDER — FUROSEMIDE 80 MG PO TABS
80.0000 mg | ORAL_TABLET | Freq: Two times a day (BID) | ORAL | Status: DC
  Administered 2018-03-21: 80 mg via ORAL
  Filled 2018-03-21: qty 1

## 2018-03-21 MED ORDER — PREDNISONE 50 MG PO TABS: 50.0000 mg | ORAL_TABLET | Freq: Every day | ORAL | Status: DC

## 2018-03-21 MED ORDER — ALUM & MAG HYDROXIDE-SIMETH 200-200-20 MG/5ML PO SUSP: 15.0000 mL | Freq: Four times a day (QID) | ORAL | Status: DC | PRN

## 2018-03-21 MED ORDER — PREDNISONE 50 MG PO TABS
50.0000 mg | ORAL_TABLET | Freq: Every day | ORAL | Status: DC
  Administered 2018-03-21 – 2018-03-22 (×2): 50 mg via ORAL
  Filled 2018-03-21 (×2): qty 1

## 2018-03-21 MED ORDER — MAGNESIUM OXIDE 400 (241.3 MG) MG PO TABS
400.0000 mg | ORAL_TABLET | Freq: Every day | ORAL | Status: DC
  Administered 2018-03-21 – 2018-03-22 (×2): 400 mg via ORAL
  Filled 2018-03-21 (×2): qty 1

## 2018-03-21 NOTE — Progress Notes (Signed)
Subjective:  Breathing better Wants to go home  Reviewed telemetry Wide complex tachycardia with underyying RBBB and LAFB  Objective:  Vital Signs in the last 24 hours: Temp:  [98.4 F (36.9 C)-98.8 F (37.1 C)] 98.7 F (37.1 C) (04/27 1225) Pulse Rate:  [90-99] 99 (04/27 1225) Resp:  [18-22] 20 (04/27 1225) BP: (86-105)/(60-82) 102/82 (04/27 1225) SpO2:  [85 %-94 %] 85 % (04/27 1225) FiO2 (%):  [32 %] 32 % (04/27 0741) Weight:  [106.2 kg (234 lb 3.2 oz)] 106.2 kg (234 lb 3.2 oz) (04/27 0451)  Intake/Output from previous day: 04/26 0701 - 04/27 0700 In: 720 [P.O.:720] Out: 2150 [Urine:2150] Intake/Output from this shift: Total I/O In: 240 [P.O.:240] Out: -   Physical Exam: Vitals reviewed. Constitutional: He is oriented to person, place, and time. He appears well-developed.  Morbidly obese  HENT:  Head: Normocephalic and atraumatic.  Right Ear: External ear normal.  Neck: Normal range of motion. Neck supple. JVD present.  Cardiovascular: Normal rate.  Murmur (II/VI holosystoic murmur mitral area) heard. Intact distal pulses  Respiratory: Effort normal and breath sounds normal. He has no wheezes. He has no rales.  GI: Soft. Bowel sounds are normal. There is no tenderness.  Musculoskeletal: He exhibits edema (2+ b/l pitting).  Lymphadenopathy:    He has no cervical adenopathy.  Neurological: He is alert and oriented to person, place, and time. No cranial nerve deficit.  Skin: Skin is warm.  Psychiatric: Appears depressed    Lab Results: Recent Labs    03/20/18 0602 03/21/18 0756  WBC 5.3 6.4  HGB 9.4* 9.7*  PLT 115* 111*   Recent Labs    03/20/18 0602 03/21/18 0756  NA 140 137  K 4.0 4.2  CL 95* 92*  CO2 33* 36*  GLUCOSE 128* 123*  BUN 19 19  CREATININE 1.25* 1.29*    EKG 03/17/2018: Sinus rhythm 98 bpm. IVCD. Old inferoposterior and anterolateral infarct. PAC's    Echocardiogram 03/18/2018 Study Conclusions  - Left ventricle: The  cavity size was severely dilated. Wall thickness was increased in a pattern of mild LVH. Systolic function was severely reduced. The estimated ejection fraction was in the range of 20% to 25%. Diffuse hypokinesis. There is akinesis of the inferolateral and inferior myocardium. Features are consistent with a pseudonormal left ventricular filling pattern, with concomitant abnormal relaxation and increased filling pressure (grade 2 diastolic dysfunction). Doppler parameters are consistent with high ventricular filling pressure. - Mitral valve: Calcified annulus. There was moderate regurgitation. - Left atrium: The atrium was moderately dilated. - Right ventricle: The cavity size was moderately dilated. Systolic function was severely reduced. - Right atrium: The atrium was moderately dilated. - Pulmonary arteries: Systolic pressure was moderately increased. PA peak pressure: 50 mm Hg (S).  Impressions:  - Diffuse hypokinesis with akinesis of the inferior and inferolateral walls; overall severely reduced LV function; moderate diastolic dysfunction; mild LVH; no apical thrombus noted using definity; 4 chamber enlargement; moderate MR; mild MR and TR; severe RV dysfunction; moderate pulmonary hypertension.   EKG 03/17/2018: Sinus rhythm 98 bpm. IVCD. Old inferoposterior and anterolateral infarct. PAC's    Echocardiogram 03/18/2018 Study Conclusions  - Left ventricle: The cavity size was severely dilated. Wall thickness was increased in a pattern of mild LVH. Systolic function was severely reduced. The estimated ejection fraction was in the range of 20% to 25%. Diffuse hypokinesis. There is akinesis of the inferolateral and inferior myocardium. Features are consistent with a pseudonormal left ventricular filling pattern, with  concomitant abnormal relaxation and increased filling pressure (grade 2 diastolic dysfunction).  Doppler parameters are consistent with high ventricular filling pressure. - Mitral valve: Calcified annulus. There was moderate regurgitation. - Left atrium: The atrium was moderately dilated. - Right ventricle: The cavity size was moderately dilated. Systolic function was severely reduced. - Right atrium: The atrium was moderately dilated. - Pulmonary arteries: Systolic pressure was moderately increased. PA peak pressure: 50 mm Hg (S).  Impressions:  - Diffuse hypokinesis with akinesis of the inferior and inferolateral walls; overall severely reduced LV function; moderate diastolic dysfunction; mild LVH; no apical thrombus noted using definity; 4 chamber enlargement; moderate MR; mild MR and TR; severe RV dysfunction; moderate pulmonary hypertension.    Assessment:  84 y/o Caucasian male  Wide complex tachycarrdia: While he remains at risk for VT, episodes of WCT have similar QRS morphology to his baseline EKG and appears to be SVT w/aberrrance. Keep K around 4, Mg around 2, as you are doing. Acute on chronic systolic and diastolic heart failure Ischemic cardiomyopathy CAD s/p CABG Morbid obesity H/o hypertension Hyperlipidemia Anemia, thrombocytopenia H/p ? Asthma on home O2. I wonder if this had been heart failure   Recommendations: He is diuresing well with IV lasix. BP remains low. I do not think he will tolerate ACE/BB as yet.   He likely has advanced stage ischemic cardiomyopathy. Long term prognosis is poor. He is not interested in any invasive workup given his advanced age and comorbidities. Recommend conservative medical therapy and outpatient follow up. He may need facility placement given limited support at home. Strict I/O Daily weight checks Sodium 2 g diet   Manish J Patwardhan 03/21/2018, 12:30 PM  Manish Emiliano Dyer, MD [facility] [place] Pager: [phone] Office: [phone] If no answer Cell [phone]

## 2018-03-21 NOTE — Progress Notes (Addendum)
   03/21/18 0753  Respiratory  Respiratory (WDL) X  Cough Productive  Bilateral Breath Sounds Diminished  Respiratory Pattern Regular;Unlabored;Dyspnea with exertion  Chest Assessment Chest expansion symmetrical  R Upper  Breath Sounds Diminished;Expiratory wheezes  L Upper Breath Sounds Diminished;Expiratory wheezes  R Lower Breath Sounds Diminished  L Lower Breath Sounds Diminished   Pt is still wheezing, Neb

## 2018-03-21 NOTE — Progress Notes (Signed)
PROGRESS NOTE    Robert Maynard  WUJ:[id number] DOB: [birth year] DOA: 03/17/2018 PCP: Homero Fellers, FNP     Brief Narrative:  Robert Maynard is a 84 y.o. male with medical history significant of past medical history of coronary artery disease, chronic kidney disease stage III, COPD dependent on 2 L of oxygen, asthma, essential hypertension who presents with lower extremity edema and shortness of breath. He saw his primary care doctor on 03/16/2017 who he double his dose of Lasix but as per chart his shortness of breath did not improve. He had orthopnea and his lower extremity edema did not improve. He was admitted for further treatment and evaluation of CHF exacerbation.  Assessment & Plan:   Principal Problem:   Acute combined systolic and diastolic congestive heart failure (HCC) Active Problems:   Cellulitis of left lower extremity   CKD (chronic kidney disease), stage III (HCC)   Acute pulmonary edema (HCC)   Acute combined systolic and diastolic CHF -Unable to locate any previous echocardiogram result and no previous diagnosis of CHF but has been on PO lasix  prn at home -BNP 1234.4 on admission  -CXR showed congestive changes with cardiac enlargement, pulmonary vascular congestion, interstitial edema, and bilateral pleural effusions -Echo EF 20-25%, grade 2 diastolic dysfunction  -IV lasix  BID, transition to PO today  BID  -Strict I/Os, daily weight. Has diuresed approx 4.5L so far  -Start coreg, will hold lisinopril to keep BP adequate  -Ted hose  -Cardiology consulted, conservative medical therapy for now   COPD -Wheezes today. Start prednisone, nebs   Wide QRS -On tele. EKG in ED also with wide QRS.  -Check EKG today  -Check Mag   LLE cellulitis -Blood culture negative to date  -Continue doxycycline  CKD stage 3  -Baseline Cr 1.1-1.3  -Stable   CAD -Continue aspirin   Chronic hypoxemic respiratory failure -On 2L [place] O2 at  baseline  GERD -Continue PPI    DVT prophylaxis: Subq hep Code Status: Full Family Communication: No family at bedside, spoke with daughter over the phone 4/26  Disposition Plan: Patient refusing SNF and will go home with home health once stable, discharge 24-48 hours with clinical improvement    Consultants:   Cardiology  Procedures:   None   Antimicrobials:  Anti-infectives (From admission, onward)   Start     Dose/Rate Route Frequency Ordered Stop   03/18/18 0445  doxycycline (VIBRA-TABS) tablet 100 mg     100 mg Oral  Once 03/18/18 0432 03/18/18 0546       Subjective: No new complaints, states he has been breathing this way for 65 years. Wants to know when he can go home   Objective: Vitals:   03/20/18 1600 03/20/18 2043 03/21/18 0451 03/21/18 0741  BP: 96/60 101/63 105/76   Pulse: 90 94 94   Resp: 20  18   Temp:  98.4 F (36.9 C) 98.8 F (37.1 C)   TempSrc:  Oral Oral   SpO2:  91% 94% 94%  Weight:   106.2 kg (234 lb 3.2 oz)   Height:        Intake/Output Summary (Last 24 hours) at 03/21/2018 1007 Last data filed at 03/21/2018 0539 Gross per 24 hour  Intake 480 ml  Output 1850 ml  Net -1370 ml   Filed Weights   03/19/18 0501 03/20/18 0612 03/21/18 0451  Weight: 107.8 kg (237 lb 10.5 oz) 107.8 kg (237 lb 10.5 oz) 106.2 kg (234 lb 3.2  oz)    Examination: General exam: Appears calm and comfortable  Respiratory system: +Wheezes bilaterally. Purse lip breathing. [place] O2  Cardiovascular system: S1 & S2 heard, RRR. No JVD, murmurs, rubs, gallops or clicks. Trace pedal edema. Gastrointestinal system: Abdomen is nondistended, soft and nontender. No organomegaly or masses felt. Normal bowel sounds heard. Central nervous system: Alert and oriented. No focal neurological deficits. Extremities: Symmetric 5 x 5 power. Psychiatry: Judgement and insight appear normal. Mood & affect appropriate.    Data Reviewed: I have personally reviewed following labs and  imaging studies  CBC: Recent Labs  Lab 03/17/18 2138 03/18/18 1151 03/20/18 0602 03/21/18 0756  WBC 6.2 5.9 5.3 6.4  HGB 9.9* 10.0* 9.4* 9.7*  HCT 33.2* 33.2* 32.3* 32.7*  MCV 89.0 88.8 89.0 88.9  PLT 101* 94* 115* 111*   Basic Metabolic Panel: Recent Labs  Lab 03/17/18 2138 03/18/18 1151 03/19/18 0411 03/20/18 0602 03/21/18 0756  NA 139  --  140 140 137  K 3.7  --  4.3 4.0 4.2  CL 99*  --  98* 95* 92*  CO2 30  --  33* 33* 36*  GLUCOSE 151*  --  137* 128* 123*  BUN 21*  --  CREATININE 1.17 1.12 1.20 1.25* 1.29*  CALCIUM 8.7*  --  8.9 9.2 8.8*  MG  --   --  1.7  --   --    GFR: Estimated Creatinine Clearance: 54.8 mL/min (A) (by C-G formula based on SCr of 1.29 mg/dL (H)). Liver Function Tests: No results for input(s): AST, ALT, ALKPHOS, BILITOT, PROT, ALBUMIN in the last 168 hours. No results for input(s): LIPASE, AMYLASE in the last 168 hours. No results for input(s): AMMONIA in the last 168 hours. Coagulation Profile: No results for input(s): INR, PROTIME in the last 168 hours. Cardiac Enzymes: No results for input(s): CKTOTAL, CKMB, CKMBINDEX, TROPONINI in the last 168 hours. BNP (last 3 results) No results for input(s): PROBNP in the last 8760 hours. HbA1C: No results for input(s): HGBA1C in the last 72 hours. CBG: No results for input(s): GLUCAP in the last 168 hours. Lipid Profile: No results for input(s): CHOL, HDL, LDLCALC, TRIG, CHOLHDL, LDLDIRECT in the last 72 hours. Thyroid Function Tests: No results for input(s): TSH, T4TOTAL, FREET4, T3FREE, THYROIDAB in the last 72 hours. Anemia Panel: No results for input(s): VITAMINB12, FOLATE, FERRITIN, TIBC, IRON, RETICCTPCT in the last 72 hours. Sepsis Labs: No results for input(s): PROCALCITON, LATICACIDVEN in the last 168 hours.  Recent Results (from the past 240 hour(s))  Culture, blood (Routine X 2) w Reflex to ID Panel     Status: None (Preliminary result)   Collection Time: 03/18/18 11:50  AM  Result Value Ref Range Status   Specimen Description BLOOD RIGHT HAND  Final   Special Requests   Final    BOTTLES DRAWN AEROBIC AND ANAEROBIC Blood Culture adequate volume   Culture   Final    NO GROWTH 2 DAYS Performed at [facility], [address], [place], [place] [address]    Report Status PENDING  Incomplete  Culture, blood (Routine X 2) w Reflex to ID Panel     Status: None (Preliminary result)   Collection Time: 03/18/18 12:06 PM  Result Value Ref Range Status   Specimen Description BLOOD LEFT ANTECUBITAL  Final   Special Requests   Final    BOTTLES DRAWN AEROBIC AND ANAEROBIC Blood Culture adequate volume   Culture   Final  NO GROWTH 2 DAYS Performed at [facility], [address], [place], [place] [address]    Report Status PENDING  Incomplete       Radiology Studies: No results found.    Scheduled Meds: . aspirin EC  325 mg Oral Daily  . carvedilol  3.125 mg Oral BID WC  . furosemide  60 mg Intravenous Q12H  . heparin  5,000 Units Subcutaneous Q8H  . multivitamin with minerals  1 tablet Oral Daily  . pantoprazole  40 mg Oral Daily  . predniSONE  50 mg Oral Q breakfast  . sodium chloride flush  3 mL Intravenous Q12H  . umeclidinium-vilanterol  1 puff Inhalation Daily   Continuous Infusions: . sodium chloride       LOS: 3 days    Time spent: 25 minutes   Noralee Stain, DO [facility] [email or web address] Password [facility] 03/21/2018, 10:07 AM

## 2018-03-22 LAB — BASIC METABOLIC PANEL
Anion gap: 7 (ref 5–15)
BUN: 29 mg/dL — AB (ref 6–20)
CALCIUM: 8.8 mg/dL — AB (ref 8.9–10.3)
CO2: 38 mmol/L — ABNORMAL HIGH (ref 22–32)
CREATININE: 1.49 mg/dL — AB (ref 0.61–1.24)
Chloride: 92 mmol/L — ABNORMAL LOW (ref 101–111)
GFR calc Af Amer: 49 mL/min — ABNORMAL LOW (ref >60)
GFR, EST NON AFRICAN AMERICAN: 42 mL/min — AB (ref >60)
GLUCOSE: 183 mg/dL — AB (ref 65–99)
Potassium: 4.4 mmol/L (ref 3.5–5.1)
Sodium: 137 mmol/L (ref 135–145)

## 2018-03-22 LAB — CBC
HEMATOCRIT: 32.5 % — AB (ref 39.0–52.0)
Hemoglobin: 9.5 g/dL — ABNORMAL LOW (ref 13.0–17.0)
MCH: 26 pg (ref 26.0–34.0)
MCHC: 29.2 g/dL — AB (ref 30.0–36.0)
MCV: 88.8 fL (ref 78.0–100.0)
PLATELETS: 133 10*3/uL — AB (ref 150–400)
RBC: 3.66 MIL/uL — ABNORMAL LOW (ref 4.22–5.81)
RDW: 15.9 % — AB (ref 11.5–15.5)
WBC: 5.9 10*3/uL (ref 4.0–10.5)

## 2018-03-22 LAB — MAGNESIUM: Magnesium: 2 mg/dL (ref 1.7–2.4)

## 2018-03-22 MED ORDER — FUROSEMIDE 40 MG PO TABS: 80.0000 mg | ORAL_TABLET | Freq: Two times a day (BID) | ORAL | 0 refills | Status: DC

## 2018-03-22 MED ORDER — FUROSEMIDE 40 MG PO TABS
80.0000 mg | ORAL_TABLET | Freq: Two times a day (BID) | ORAL | 0 refills | Status: AC
Start: 2018-03-22 — End: 2018-04-21

## 2018-03-22 MED ORDER — DOXYCYCLINE HYCLATE 100 MG PO TABS: 100.0000 mg | ORAL_TABLET | Freq: Two times a day (BID) | ORAL | 0 refills | Status: AC

## 2018-03-22 MED ORDER — POTASSIUM CHLORIDE ER 10 MEQ PO TBCR: 10.0000 meq | EXTENDED_RELEASE_TABLET | Freq: Two times a day (BID) | ORAL | 0 refills | Status: AC

## 2018-03-22 MED ORDER — PREDNISONE 50 MG PO TABS: 50.0000 mg | ORAL_TABLET | Freq: Every day | ORAL | 0 refills | Status: AC

## 2018-03-22 MED ORDER — CARVEDILOL 3.125 MG PO TABS: 3.1250 mg | ORAL_TABLET | Freq: Two times a day (BID) | ORAL | 0 refills | Status: DC

## 2018-03-22 MED ORDER — PREDNISONE 50 MG PO TABS: 50.0000 mg | ORAL_TABLET | Freq: Every day | ORAL | 0 refills | Status: DC

## 2018-03-22 MED ORDER — DOXYCYCLINE HYCLATE 100 MG PO TABS: 100.0000 mg | ORAL_TABLET | Freq: Two times a day (BID) | ORAL | 0 refills | Status: DC

## 2018-03-22 MED ORDER — CARVEDILOL 3.125 MG PO TABS: 3.1250 mg | ORAL_TABLET | Freq: Two times a day (BID) | ORAL | 0 refills | Status: AC

## 2018-03-22 MED ORDER — FUROSEMIDE 40 MG PO TABS: 40.0000 mg | ORAL_TABLET | Freq: Two times a day (BID) | ORAL | Status: DC

## 2018-03-22 MED ORDER — POTASSIUM CHLORIDE ER 10 MEQ PO TBCR: 10.0000 meq | EXTENDED_RELEASE_TABLET | Freq: Two times a day (BID) | ORAL | 0 refills | Status: DC

## 2018-03-22 NOTE — Progress Notes (Signed)
Occupational Therapy Treatment Patient Details Name: BASSEL GASKILL MRN: [id number] DOB: 10-14-1933 Today's Date: 03/22/2018    History of present illness Pt is an 84 y.o. male admitted 03/17/18 for SOB and LE edema; worked up for CHF exacerbation. PMH includes CAD< CKD II, COPD (2L home O2), asthma.    OT comments  Pt. Able to complete sit/stand with min a for simulated toileting and functional mobility tasks.  Eager for d/c home.  Reports he has an aide and family assistance at home.  Follow Up Recommendations  Home health OT    Equipment Recommendations  None recommended by OT    Recommendations for Other Services      Precautions / Restrictions Precautions Precautions: Fall Restrictions Weight Bearing Restrictions: No       Mobility Bed Mobility               General bed mobility comments: seated in recliner at beginning and end of session  Transfers Overall transfer level: Needs assistance Equipment used: Rolling walker (2 wheeled) Transfers: Sit to/from [employer] Transfers Sit to Stand: Min guard              Balance                                           ADL either performed or assessed with clinical judgement   ADL Overall ADL's : Needs assistance/impaired                         Toilet Transfer: Minimal assistance Toilet Transfer Details (indicate cue type and reason): simulated with recliner in room Toileting- Clothing Manipulation and Hygiene: Minimal assistance Toileting - Clothing Manipulation Details (indicate cue type and reason): simulated in room     Functional mobility during ADLs: Minimal assistance General ADL Comments: pt. reports having an aide that will be with him.  also states his dtr. often helps him get dressed.       Vision       Perception     Praxis      Cognition Arousal/Alertness: Awake/alert Behavior During Therapy: WFL for tasks assessed/performed Overall Cognitive  Status: Within Functional Limits for tasks assessed                                          Exercises     Shoulder Instructions       General Comments      Pertinent Vitals/ Pain       Pain Assessment: No/denies pain  Home Living                                          Prior Functioning/Environment              Frequency           Progress Toward Goals  OT Goals(current goals can now be found in the care plan section)  Progress towards OT goals: Progressing toward goals     Plan      Co-evaluation                 AM-PAC PT "6 Clicks" Daily Activity  Outcome Measure   Help from another person eating meals?: None Help from another person taking care of personal grooming?: A Little Help from another person toileting, which includes using toliet, bedpan, or urinal?: A Little Help from another person bathing (including washing, rinsing, drying)?: A Lot Help from another person to put on and taking off regular upper body clothing?: A Little Help from another person to put on and taking off regular lower body clothing?: A Lot 6 Click Score: 17    End of Session Equipment Utilized During Treatment: Gait belt;Rolling walker;Oxygen      Activity Tolerance Patient tolerated treatment well   Patient Left in chair;with chair alarm set;with call bell/phone within reach   Nurse Communication          Time: [phone] OT Time Calculation (min): 12 min  Charges: OT General Charges $OT Visit: 1 Visit OT Treatments $Self Care/Home Management : 8-22 mins  Robet Leu, COTA/L 03/22/2018, 9:31 AM

## 2018-03-22 NOTE — Discharge Summary (Signed)
Physician Discharge Summary  Robert Maynard YNW:[id number] DOB: 09-21-1933 DOA: 03/17/2018  PCP: Homero Fellers, FNP  Admit date: 03/17/2018 Discharge date: 03/22/2018  Admitted From: Home Disposition:  Home, refused SNF numerous times, family aware and okay with taking patient home with home health    Recommendations for Outpatient Follow-up:  1. Follow up with PCP in 1 week 2. Follow up with Dr. Rosemary Holms in 1 week 3. Please obtain BMP and Mg in 1 week. Goal potassium 4 and magnesium 2 4. Cr mildly increased 1.49 on discharge, baseline of 1.1-1.3. Continue to monitor Cr while on diuretic tx.   Home Health: PT OT RN Aide  Equipment/Devices: None    Discharge Condition: Stable CODE STATUS: Full  Diet recommendation: Heart healthy, low sodium   Brief/Interim Summary: Robert Maynard is a 84 y.o.malewith medical history significant ofpast medical history of coronary artery disease, chronic kidney disease stage III, COPD dependent on 2 L of oxygen, asthma, essential hypertension who presents with lower extremity edema and shortness of breath. He saw his primary care doctor on 03/16/2017 who he double his dose of Lasix but as per chart his shortness of breath did not improve. He had orthopnea and his lower extremity edema did not improve. He was admitted for further treatment and evaluation of CHF exacerbation. Echocardiogram completed which revealed 20-25%, grade 2 diastolic dysfunction. Cardiology was consulted. Patient was given IV lasix with diuresis. Due to increase in pulmonary wheezing, he was also started on prednisone. On 4/28, he was doing better, lungs clear to ascultation, peripheral edema much improved from admission. He was encouraged to follow up with PCP as well as cardiology. Reviewed symptoms including shortness of breath, worsening peripheral edema, chest pain as reasons to seek care back in the emergency department. Discharge plan reviewed with daughter over the phone as well  and all questions answered.   Discharge Diagnoses:  Principal Problem:   Acute combined systolic and diastolic congestive heart failure (HCC) Active Problems:   Cellulitis of left lower extremity   CKD (chronic kidney disease), stage III (HCC)   Acute pulmonary edema (HCC)   Acute combined systolic and diastolic CHF -Unable to locate any previous echocardiogram result and no previous diagnosis of CHF but has been on PO lasix  prn at home -BNP 1234.4 on admission  -CXR showed congestive changes with cardiac enlargement, pulmonary vascular congestion, interstitial edema, and bilateral pleural effusions -Echo EF 20-25%, grade 2 diastolic dysfunction  -Strict I/Os, daily weight. Has diuresed approx 4.8L so far and down 6 lbs  -Start coreg, will hold lisinopril to keep BP adequate  -Cardiology consulted, conservative medical therapy for now  -Diuresed well with clinical improvement on IV lasix  IV BID, transition to  PO BID   COPD -Wheezes today. Start prednisone, nebs   Wide complex tachycardia  -On tele. EKG in ED also with wide QRS.  -Reviewed by cardiology, "While he remains at risk for VT, episodes of WCT have similar QRS morphology to his baseline EKG and appears to be SVT w/aberrrance."  -Continue to monitor BMP and Mag, K goal of 4, Mg goal of 2   LLE cellulitis -Blood culture negative to date  -Continue doxycycline -Improved   CKD stage 3  -Baseline Cr 1.1-1.3 (labs reviewed in Care Everywhere)  -Stable, mildly increased to 1.49 today due to diuretic tx. Monitor BMP closely as outpatient   CAD -Continue aspirin   Chronic hypoxemic respiratory failure -On 2L [place] O2 at baseline  GERD -  Continue PPI      Discharge Instructions  Discharge Instructions    (HEART FAILURE PATIENTS) Call MD:  Anytime you have any of the following symptoms: 1) 3 pound weight gain in 24 hours or 5 pounds in 1 week 2) shortness of breath, with or without a dry hacking  cough 3) swelling in the hands, feet or stomach 4) if you have to sleep on extra pillows at night in order to breathe.   Complete by:  As directed    Call MD for:  difficulty breathing, headache or visual disturbances   Complete by:  As directed    Call MD for:  extreme fatigue   Complete by:  As directed    Call MD for:  persistant dizziness or light-headedness   Complete by:  As directed    Call MD for:  persistant nausea and vomiting   Complete by:  As directed    Call MD for:  severe uncontrolled pain   Complete by:  As directed    Call MD for:  temperature >100.4   Complete by:  As directed    Diet - low sodium heart healthy   Complete by:  As directed    Discharge instructions   Complete by:  As directed    You were cared for by a hospitalist during your hospital stay. If you have any questions about your discharge medications or the care you received while you were in the hospital after you are discharged, you can call the unit and asked to speak with the hospitalist on call if the hospitalist that took care of you is not available. Once you are discharged, your primary care physician will handle any further medical issues. Please note that NO REFILLS for any discharge medications will be authorized once you are discharged, as it is imperative that you return to your primary care physician (or establish a relationship with a primary care physician if you do not have one) for your aftercare needs so that they can reassess your need for medications and monitor your lab values.   Increase activity slowly   Complete by:  As directed      Allergies as of 03/22/2018      Reactions   Penicillins Rash      Medication List    STOP taking these medications   doxazosin 4 MG tablet Commonly known as:  CARDURA     TAKE these medications   ANORO ELLIPTA 62.5-25 MCG/INH Aepb Generic drug:  umeclidinium-vilanterol Inhale 1 puff into the lungs daily.   aspirin EC 325 MG tablet Take 325  mg by mouth daily.   carvedilol 3.125 MG tablet Commonly known as:  COREG Take 1 tablet (3.125 mg total) by mouth 2 (two) times daily with a meal.   doxycycline 100 MG tablet Commonly known as:  VIBRA-TABS Take 1 tablet (100 mg total) by mouth every 12 (twelve) hours for 3 days.   furosemide 40 MG tablet Commonly known as:  LASIX Take 2 tablets (80 mg total) by mouth 2 (two) times daily. What changed:    medication strength  how much to take  when to take this  reasons to take this   pantoprazole 40 MG tablet Commonly known as:  PROTONIX Take 40 mg by mouth daily.   potassium chloride 10 MEQ tablet Commonly known as:  KLOR-CON 10 Take 1 tablet (10 mEq total) by mouth 2 (two) times daily.   predniSONE 50 MG tablet Commonly known as:  DELTASONE Take 1 tablet (50 mg total) by mouth daily with breakfast for 4 days.      Follow-up Information    [facility]   Specialty:  Home Health Services Why:  They will do your home health care at your home Contact information: [address] [place] [address] [phone]        Homero Fellers, FNP. Schedule an appointment as soon as possible for a visit in 1 week(s).   Specialty:  Nurse Practitioner Contact information: [phone] [facility] [address] [place] [address] [phone]        Elder Negus, MD. Schedule an appointment as soon as possible for a visit in 1 week(s).   Specialty:  Cardiology Contact information: [address] [place] [address] [phone]          Allergies  Allergen Reactions  . Penicillins Rash    Consultations:  Cardiology    Procedures/Studies: Dg Chest 2 View  Result Date: 03/17/2018 CLINICAL DATA:  Two week history of increased shortness of breath and bilateral lower extremity edema. EXAM: CHEST - 2 VIEW COMPARISON:  None. FINDINGS: Postoperative changes in the mediastinum. Fractures of 2  superior sternotomy wires. Diffuse cardiac enlargement. Mild pulmonary vascular congestion. Slight interstitial pattern in the lung bases likely due to edema. Small bilateral pleural effusions. No focal consolidation. No pneumothorax. Calcified and tortuous aorta. Degenerative changes in the spine. IMPRESSION: 1. Congestive changes with cardiac enlargement, pulmonary vascular congestion, interstitial edema, and bilateral pleural effusions. 2. Aortic atherosclerosis. Electronically Signed   By: Burman Nieves M.D.   On: 03/17/2018 22:27    Echo 03/18/18 Study Conclusions  - Left ventricle: The cavity size was severely dilated. Wall   thickness was increased in a pattern of mild LVH. Systolic   function was severely reduced. The estimated ejection fraction   was in the range of 20% to 25%. Diffuse hypokinesis. There is   akinesis of the inferolateral and inferior myocardium. Features   are consistent with a pseudonormal left ventricular filling   pattern, with concomitant abnormal relaxation and increased   filling pressure (grade 2 diastolic dysfunction). Doppler   parameters are consistent with high ventricular filling pressure. - Mitral valve: Calcified annulus. There was moderate   regurgitation. - Left atrium: The atrium was moderately dilated. - Right ventricle: The cavity size was moderately dilated. Systolic   function was severely reduced. - Right atrium: The atrium was moderately dilated. - Pulmonary arteries: Systolic pressure was moderately increased.   PA peak pressure: 50 mm Hg (S).  Impressions:  - Diffuse hypokinesis with akinesis of the inferior and   inferolateral walls; overall severely reduced LV function;   moderate diastolic dysfunction; mild LVH; no apical thrombus   noted using definity; 4 chamber enlargement; moderate MR; mild MR   and TR; severe RV dysfunction; moderate pulmonary hypertension.    Discharge Exam: Vitals:   03/22/18 0442 03/22/18 0831   BP: 95/67   Pulse: 81   Resp: 18   Temp: 98.4 F (36.9 C)   SpO2: 91% 96%    General: Pt is alert, awake, not in acute distress Cardiovascular: RRR, S1/S2 +, no rubs, no gallops Respiratory: CTA bilaterally, no wheezing, no rhonchi, on [place] O2 (baseline) without conversational dyspnea  Abdominal: Soft, NT, ND, bowel sounds + Extremities: trace edema, no cyanosis, +ted hose    The results of significant diagnostics from this hospitalization (including imaging, microbiology, ancillary and  laboratory) are listed below for reference.     Microbiology: Recent Results (from the past 240 hour(s))  Culture, blood (Routine X 2) w Reflex to ID Panel     Status: None (Preliminary result)   Collection Time: 03/18/18 11:50 AM  Result Value Ref Range Status   Specimen Description BLOOD RIGHT HAND  Final   Special Requests   Final    BOTTLES DRAWN AEROBIC AND ANAEROBIC Blood Culture adequate volume   Culture   Final    NO GROWTH 3 DAYS Performed at [facility], [address] [place], [place] [address]    Report Status PENDING  Incomplete  Culture, blood (Routine X 2) w Reflex to ID Panel     Status: None (Preliminary result)   Collection Time: 03/18/18 12:06 PM  Result Value Ref Range Status   Specimen Description BLOOD LEFT ANTECUBITAL  Final   Special Requests   Final    BOTTLES DRAWN AEROBIC AND ANAEROBIC Blood Culture adequate volume   Culture   Final    NO GROWTH 3 DAYS Performed at [facility], [address] [place], [place] [address]    Report Status PENDING  Incomplete     Labs: BNP (last 3 results) Recent Labs    03/17/18 2138  BNP 1,234.4*   Basic Metabolic Panel: Recent Labs  Lab 03/17/18 2138 03/18/18 1151 03/19/18 0411 03/20/18 0602 03/21/18 0756 03/21/18 0912 03/22/18 0341  NA 139  --  140 140 137  --  137  K 3.7  --  4.3 4.0 4.2  --  4.4  CL 99*  --  98* 95* 92*  --  92*  CO2 30  --  33* 33* 36*  --  38*  GLUCOSE 151*  --  137* 128*  123*  --  183*  BUN 21*  --  19 19 19   --  29*  CREATININE 1.17 1.12 1.20 1.25* 1.29*  --  1.49*  CALCIUM 8.7*  --  8.9 9.2 8.8*  --  8.8*  MG  --   --  1.7  --   --  1.7 2.0   Liver Function Tests: No results for input(s): AST, ALT, ALKPHOS, BILITOT, PROT, ALBUMIN in the last 168 hours. No results for input(s): LIPASE, AMYLASE in the last 168 hours. No results for input(s): AMMONIA in the last 168 hours. CBC: Recent Labs  Lab 03/17/18 2138 03/18/18 1151 03/20/18 0602 03/21/18 0756 03/22/18 0341  WBC 6.2 5.9 5.3 6.4 5.9  HGB 9.9* 10.0* 9.4* 9.7* 9.5*  HCT 33.2* 33.2* 32.3* 32.7* 32.5*  MCV 89.0 88.8 89.0 88.9 88.8  PLT 101* 94* 115* 111* 133*   Cardiac Enzymes: No results for input(s): CKTOTAL, CKMB, CKMBINDEX, TROPONINI in the last 168 hours. BNP: Invalid input(s): POCBNP CBG: No results for input(s): GLUCAP in the last 168 hours. D-Dimer No results for input(s): DDIMER in the last 72 hours. Hgb A1c No results for input(s): HGBA1C in the last 72 hours. Lipid Profile No results for input(s): CHOL, HDL, LDLCALC, TRIG, CHOLHDL, LDLDIRECT in the last 72 hours. Thyroid function studies No results for input(s): TSH, T4TOTAL, T3FREE, THYROIDAB in the last 72 hours.  Invalid input(s): FREET3 Anemia work up No results for input(s): VITAMINB12, FOLATE, FERRITIN, TIBC, IRON, RETICCTPCT in the last 72 hours. Urinalysis No results found for: COLORURINE, APPEARANCEUR, LABSPEC, PHURINE, GLUCOSEU, HGBUR, BILIRUBINUR, KETONESUR, PROTEINUR, UROBILINOGEN, NITRITE, LEUKOCYTESUR Sepsis Labs Invalid input(s): PROCALCITONIN,  WBC,  LACTICIDVEN Microbiology Recent Results (from the  past 240 hour(s))  Culture, blood (Routine X 2) w Reflex to ID Panel     Status: None (Preliminary result)   Collection Time: 03/18/18 11:50 AM  Result Value Ref Range Status   Specimen Description BLOOD RIGHT HAND  Final   Special Requests   Final    BOTTLES DRAWN AEROBIC AND ANAEROBIC Blood Culture adequate  volume   Culture   Final    NO GROWTH 3 DAYS Performed at [facility], [address], [place], [place] [address]    Report Status PENDING  Incomplete  Culture, blood (Routine X 2) w Reflex to ID Panel     Status: None (Preliminary result)   Collection Time: 03/18/18 12:06 PM  Result Value Ref Range Status   Specimen Description BLOOD LEFT ANTECUBITAL  Final   Special Requests   Final    BOTTLES DRAWN AEROBIC AND ANAEROBIC Blood Culture adequate volume   Culture   Final    NO GROWTH 3 DAYS Performed at [facility], [address], [place], [place] [address]    Report Status PENDING  Incomplete     Patient was seen and examined on the day of discharge and was found to be in stable condition. Time coordinating discharge: 35 minutes including assessment and coordination of care, as well as examination of the patient.   SIGNED:  Noralee Stain, DO [facility] Pager [phone]  If 7PM-7AM, please contact night-coverage [email or web address] Password [facility] 03/22/2018, 8:43 AM

## 2018-03-22 NOTE — Care Management Note (Signed)
Case Management Note  Patient Details  Name: Robert Maynard MRN: [id number] Date of Birth: 1933/03/20  Subjective/Objective:                    Action/Plan: Pt discharging home with [facility] services. Only PO box for address--pt states he lives at [employer] in [place]. This information given to [facility] with [facility]. Pt states his daughter is going to provide transportation home and she will bring an oxygen tank for transport. Pt states his concentrator has been switched out at home while hes been in hospital and he will have a new system.    Expected Discharge Date:  03/22/18               Expected Discharge Plan:  Skilled Nursing Facility  In-House Referral:     Discharge planning Services  CM Consult  Post Acute Care Choice:    Choice offered to:  Spouse  DME Arranged:    DME Agency:     HH Arranged:  RN, Disease Management, PT, Nurse's Aide, Social Work [employer] Agency:  [facility]  Status of Service:  Completed, signed off  If discussed at [employer], dates discussed:    Additional Comments:  Kermit Balo, RN 03/22/2018, 9:22 AM

## 2018-03-22 NOTE — Progress Notes (Signed)
Pt got discharged to home, discharge instructions provided and patient showed understanding to it, IV taken out,Telemonitor DC,pt left unit in wheelchair with all of the belongings accompanied with a family member (Daughter) 

## 2018-03-23 LAB — CULTURE, BLOOD (ROUTINE X 2)
CULTURE: NO GROWTH
Culture: NO GROWTH
Special Requests: ADEQUATE
Special Requests: ADEQUATE

## 2018-03-24 DIAGNOSIS — L03116 Cellulitis of left lower limb: Secondary | ICD-10-CM | POA: Diagnosis not present

## 2018-03-24 DIAGNOSIS — Z7982 Long term (current) use of aspirin: Secondary | ICD-10-CM | POA: Diagnosis not present

## 2018-03-24 DIAGNOSIS — Z9981 Dependence on supplemental oxygen: Secondary | ICD-10-CM | POA: Diagnosis not present

## 2018-03-24 DIAGNOSIS — I5041 Acute combined systolic (congestive) and diastolic (congestive) heart failure: Secondary | ICD-10-CM | POA: Diagnosis not present

## 2018-03-24 DIAGNOSIS — I130 Hypertensive heart and chronic kidney disease with heart failure and stage 1 through stage 4 chronic kidney disease, or unspecified chronic kidney disease: Secondary | ICD-10-CM | POA: Diagnosis not present

## 2018-03-24 DIAGNOSIS — N183 Chronic kidney disease, stage 3 (moderate): Secondary | ICD-10-CM | POA: Diagnosis not present

## 2018-03-24 DIAGNOSIS — J45909 Unspecified asthma, uncomplicated: Secondary | ICD-10-CM | POA: Diagnosis not present

## 2018-03-24 DIAGNOSIS — Z7952 Long term (current) use of systemic steroids: Secondary | ICD-10-CM | POA: Diagnosis not present

## 2018-03-24 DIAGNOSIS — J449 Chronic obstructive pulmonary disease, unspecified: Secondary | ICD-10-CM | POA: Diagnosis not present

## 2018-03-24 DIAGNOSIS — Z7951 Long term (current) use of inhaled steroids: Secondary | ICD-10-CM | POA: Diagnosis not present

## 2018-03-24 DIAGNOSIS — I252 Old myocardial infarction: Secondary | ICD-10-CM | POA: Diagnosis not present

## 2018-03-24 DIAGNOSIS — L03115 Cellulitis of right lower limb: Secondary | ICD-10-CM | POA: Diagnosis not present

## 2018-03-24 DIAGNOSIS — I2510 Atherosclerotic heart disease of native coronary artery without angina pectoris: Secondary | ICD-10-CM | POA: Diagnosis not present

## 2018-03-27 ENCOUNTER — Other Ambulatory Visit: Payer: Self-pay

## 2018-03-27 ENCOUNTER — Inpatient Hospital Stay (HOSPITAL_COMMUNITY)
Admission: EM | Admit: 2018-03-27 | Discharge: 2018-04-25 | DRG: 208 | Disposition: E | Payer: PPO | Attending: Critical Care Medicine | Admitting: Critical Care Medicine

## 2018-03-27 DIAGNOSIS — Z9981 Dependence on supplemental oxygen: Secondary | ICD-10-CM | POA: Diagnosis not present

## 2018-03-27 DIAGNOSIS — I469 Cardiac arrest, cause unspecified: Secondary | ICD-10-CM | POA: Diagnosis not present

## 2018-03-27 DIAGNOSIS — K219 Gastro-esophageal reflux disease without esophagitis: Secondary | ICD-10-CM | POA: Diagnosis not present

## 2018-03-27 DIAGNOSIS — I2510 Atherosclerotic heart disease of native coronary artery without angina pectoris: Secondary | ICD-10-CM | POA: Diagnosis present

## 2018-03-27 DIAGNOSIS — R778 Other specified abnormalities of plasma proteins: Secondary | ICD-10-CM | POA: Diagnosis present

## 2018-03-27 DIAGNOSIS — N183 Chronic kidney disease, stage 3 unspecified: Secondary | ICD-10-CM | POA: Diagnosis present

## 2018-03-27 DIAGNOSIS — J441 Chronic obstructive pulmonary disease with (acute) exacerbation: Secondary | ICD-10-CM | POA: Diagnosis not present

## 2018-03-27 DIAGNOSIS — R0602 Shortness of breath: Secondary | ICD-10-CM | POA: Diagnosis not present

## 2018-03-27 DIAGNOSIS — R4189 Other symptoms and signs involving cognitive functions and awareness: Secondary | ICD-10-CM

## 2018-03-27 DIAGNOSIS — L03116 Cellulitis of left lower limb: Secondary | ICD-10-CM | POA: Diagnosis present

## 2018-03-27 DIAGNOSIS — R0902 Hypoxemia: Secondary | ICD-10-CM

## 2018-03-27 DIAGNOSIS — I130 Hypertensive heart and chronic kidney disease with heart failure and stage 1 through stage 4 chronic kidney disease, or unspecified chronic kidney disease: Secondary | ICD-10-CM | POA: Diagnosis not present

## 2018-03-27 DIAGNOSIS — I509 Heart failure, unspecified: Secondary | ICD-10-CM

## 2018-03-27 DIAGNOSIS — I252 Old myocardial infarction: Secondary | ICD-10-CM | POA: Diagnosis not present

## 2018-03-27 DIAGNOSIS — I10 Essential (primary) hypertension: Secondary | ICD-10-CM | POA: Diagnosis present

## 2018-03-27 DIAGNOSIS — J9621 Acute and chronic respiratory failure with hypoxia: Principal | ICD-10-CM | POA: Diagnosis present

## 2018-03-27 DIAGNOSIS — Z880 Allergy status to penicillin: Secondary | ICD-10-CM | POA: Diagnosis not present

## 2018-03-27 DIAGNOSIS — R7989 Other specified abnormal findings of blood chemistry: Secondary | ICD-10-CM | POA: Diagnosis present

## 2018-03-27 DIAGNOSIS — Z8249 Family history of ischemic heart disease and other diseases of the circulatory system: Secondary | ICD-10-CM | POA: Diagnosis not present

## 2018-03-27 DIAGNOSIS — Z79899 Other long term (current) drug therapy: Secondary | ICD-10-CM

## 2018-03-27 DIAGNOSIS — Z7982 Long term (current) use of aspirin: Secondary | ICD-10-CM | POA: Diagnosis not present

## 2018-03-27 DIAGNOSIS — R748 Abnormal levels of other serum enzymes: Secondary | ICD-10-CM | POA: Diagnosis not present

## 2018-03-27 DIAGNOSIS — I5021 Acute systolic (congestive) heart failure: Secondary | ICD-10-CM

## 2018-03-27 DIAGNOSIS — I248 Other forms of acute ischemic heart disease: Secondary | ICD-10-CM | POA: Diagnosis present

## 2018-03-27 DIAGNOSIS — I5041 Acute combined systolic (congestive) and diastolic (congestive) heart failure: Secondary | ICD-10-CM | POA: Diagnosis present

## 2018-03-27 DIAGNOSIS — I110 Hypertensive heart disease with heart failure: Secondary | ICD-10-CM | POA: Diagnosis not present

## 2018-03-27 DIAGNOSIS — N179 Acute kidney failure, unspecified: Secondary | ICD-10-CM | POA: Diagnosis not present

## 2018-03-27 DIAGNOSIS — R031 Nonspecific low blood-pressure reading: Secondary | ICD-10-CM | POA: Diagnosis not present

## 2018-03-27 DIAGNOSIS — Z87891 Personal history of nicotine dependence: Secondary | ICD-10-CM | POA: Diagnosis not present

## 2018-03-27 DIAGNOSIS — R4020 Unspecified coma: Secondary | ICD-10-CM | POA: Diagnosis not present

## 2018-03-27 NOTE — ED Triage Notes (Signed)
Pt to ED via [facility] from home, recently treated for pneumonia and d/c'd. Pt on bipap on arrival. Pt denies pain. NAD, on nasal cannula at this time.

## 2018-03-27 NOTE — ED Provider Notes (Signed)
[facility] EMERGENCY DEPARTMENT Provider Note   CSN: [id number] Arrival date & time: 03/26/2018  2328   History   Chief Complaint Chief Complaint  Patient presents with  . Weakness  . Shortness of Breath    HPI Robert Maynard is a 84 y.o. male.  The history is provided by the patient.  Weakness  This is a new problem. The current episode started more than 2 days ago. The problem has been gradually worsening. There was no focality noted. There has been no fever. Associated symptoms include shortness of breath. Pertinent negatives include no chest pain and no vomiting.  Shortness of Breath  Pertinent negatives include no fever, no chest pain and no vomiting.  Patient with history of CHF, chronic kidney disease presents with worsening shortness of breath and weakness.  He was recently diagnosed with heart failure and discharged from the hospital, but since that time is been getting worse.  He reports generalized weakness and shortness of breath No chest pain.  No fever.  He reports coughing up yellow sputum.  No hemoptysis. No falls  Daughter reports the patient has been getting weak since leaving the hospital.  He she reports he has been very difficult to manage at home.  Patient was offered SNF placement but he refused  Past Medical History:  Diagnosis Date  . Acute pulmonary edema (HCC) 03/18/2018  . Asthma   . CHF (congestive heart failure) (HCC)   . CKD (chronic kidney disease), stage III (HCC)   . Hypertension   . MI (myocardial infarction) [facility])     Patient Active Problem List   Diagnosis Date Noted  . Acute combined systolic and diastolic congestive heart failure (HCC) 03/18/2018  . Cellulitis of left lower extremity 03/18/2018  . CKD (chronic kidney disease), stage III (HCC) 03/18/2018  . Acute pulmonary edema (HCC) 03/18/2018    Past Surgical History:  Procedure Laterality Date  . CARDIAC SURGERY          Home Medications    Prior to  Admission medications   Medication Sig Start Date End Date Taking? Authorizing Provider  aspirin EC 325 MG tablet Take 325 mg by mouth daily.    [provider]  carvedilol (COREG) 3.125 MG tablet Take 1 tablet (3.125 mg total) by mouth 2 (two) times daily with a meal. 03/22/18   Noralee Stain, DO  furosemide (LASIX) 40 MG tablet Take 2 tablets (80 mg total) by mouth 2 (two) times daily. 03/22/18 04/21/18  Noralee Stain, DO  pantoprazole (PROTONIX) 40 MG tablet Take 40 mg by mouth daily.    [provider]  potassium chloride (KLOR-CON 10) 10 MEQ tablet Take 1 tablet (10 mEq total) by mouth 2 (two) times daily. 03/22/18   Noralee Stain, DO  umeclidinium-vilanterol (ANORO ELLIPTA) 62.5-25 MCG/INH AEPB Inhale 1 puff into the lungs daily.    [provider]    Family History No family history on file.  Social History Social History   Tobacco Use  . Smoking status: Former Smoker    Packs/day: 2.00    Years: 30.00    Pack years: 60.00    Types: Cigarettes  . Smokeless tobacco: Current User    Types: Chew  Substance Use Topics  . Alcohol use: Never    Frequency: Never  . Drug use: Never     Allergies   Penicillins   Review of Systems Review of Systems  Constitutional: Negative for fever.  Respiratory: Positive for shortness of  breath.   Cardiovascular: Negative for chest pain.  Gastrointestinal: Negative for vomiting.  Neurological: Positive for weakness. Negative for syncope.  All other systems reviewed and are negative.    Physical Exam Updated Vital Signs BP 103/67 (BP Location: Right Arm)   Pulse 91   Temp 98 F (36.7 C) (Oral)   Resp 18   Ht 1.803 m ( )   Wt 106.1 kg (234 lb)   SpO2 (!) 87%   BMI 32.64 kg/m   Physical Exam CONSTITUTIONAL: Elderly and mildly distressed HEAD: Normocephalic/atraumatic EYES: EOMI/PERRL ENMT: Mucous membranes moist NECK: supple no meningeal signs SPINE/BACK:entire spine nontender CV: S1/S2  noted LUNGS: Coarse breath sounds bilaterally, scattered wheeze noted, mild tachypnea ABDOMEN: soft, nontender, no rebound or guarding, bowel sounds noted throughout abdomen GU:no cva tenderness NEURO: Pt is awake/alert/appropriate, moves all extremitiesx4.  No facial droop.  No focal weakness noted EXTREMITIES: pulses normal/equal, full ROM, symmetric pitting edema bilateral lower extremities SKIN: warm, color normal PSYCH: no abnormalities of mood noted, alert and oriented to situation   ED Treatments / Results  Labs (all labs ordered are listed, but only abnormal results are displayed) Labs Reviewed  BASIC METABOLIC PANEL - Abnormal; Notable for the following components:      Result Value   Chloride 90 (*)    Glucose, Bld 186 (*)    BUN 66 (*)    Creatinine, Ser 1.91 (*)    GFR calc non Af Amer 31 (*)    GFR calc Af Amer 36 (*)    Anion gap 17 (*)    All other components within normal limits  CBC WITH DIFFERENTIAL/PLATELET - Abnormal; Notable for the following components:   WBC 11.4 (*)    Hemoglobin 11.8 (*)    HCT 38.8 (*)    MCH 25.9 (*)    RDW 16.7 (*)    Platelets 138 (*)    Neutro Abs 8.5 (*)    Monocytes Absolute 1.4 (*)    All other components within normal limits  TROPONIN I - Abnormal; Notable for the following components:   Troponin I 0.06 (*)    All other components within normal limits  BRAIN NATRIURETIC PEPTIDE - Abnormal; Notable for the following components:   B Natriuretic Peptide 2,751.4 (*)    All other components within normal limits  URINALYSIS, ROUTINE W REFLEX MICROSCOPIC - Abnormal; Notable for the following components:   APPearance HAZY (*)    All other components within normal limits    EKG EKG Interpretation  Date/Time:  Friday Apr 07, 2018 23:35:36 EDT Ventricular Rate:  89 PR Interval:    QRS Duration: 158 QT Interval:  433 QTC Calculation: 527 R Axis:   -74 Text Interpretation:  Sinus rhythm Probable left atrial enlargement Right  bundle branch block LVH with IVCD and secondary repol abnrm   No significant change since last tracing Confirmed by Zadie Rhine ([id number]) on 07-Apr-2018 11:39:32 PM   Radiology Dg Chest 2 View  Result Date: 04/02/2018 CLINICAL DATA:  Shortness of breath EXAM: CHEST - 2 VIEW COMPARISON:  03/17/2018, 10/17/2015 FINDINGS: Trace pleural effusions. Cardiomegaly with vascular congestion. Post sternotomy changes. Patchy atelectasis at the left base. Aortic atherosclerosis. No pneumothorax. IMPRESSION: 1. Cardiomegaly with vascular congestion and minimal interstitial edema 2. Trace pleural effusion.  Streaky atelectasis at the left base. Electronically Signed   By: Jasmine Pang M.D.   On: 04/08/2018 00:13    Procedures Procedures    CRITICAL CARE Performed by: Joya Gaskins  Total critical care time: 35 minutes Critical care time was exclusive of separately billable procedures and treating other patients. Critical care was necessary to treat or prevent imminent or life-threatening deterioration. Critical care was time spent personally by me on the following activities: development of treatment plan with patient and/or surrogate as well as nursing, discussions with consultants, evaluation of patient's response to treatment, examination of patient, obtaining history from patient or surrogate, ordering and performing treatments and interventions, ordering and review of laboratory studies, ordering and review of radiographic studies, pulse oximetry and re-evaluation of patient's condition. Patient with worsening CHF requiring increased oxygen therapy.  Patient was hypoxic on arrival Medications Ordered in ED Medications  albuterol (PROVENTIL) (2.5 MG/3ML) 0.083% nebulizer solution 5 mg (5 mg Nebulization Given 04-Apr-2018 0056)  ipratropium (ATROVENT) nebulizer solution 0.5 mg (0.5 mg Nebulization Given Apr 04, 2018 0056)  furosemide (LASIX) injection 40 mg (40 mg Intravenous Given 2018/04/04 0103)     Initial  Impression / Assessment and Plan / ED Course  I have reviewed the triage vital signs and the nursing notes.  Pertinent labs & imaging results that were available during my care of the patient were reviewed by me and considered in my medical decision making (see chart for details).     12:06 AM He reports weakness/shortness of breath since being discharged the hospital.  He has had worsening hypoxia, home O2 at 2 L and is been in the 80s.  Suspect recurrent pulmonary edema 2:00 AM Patient with recurrent CHF.  Patient also having worsening renal dysfunction.  His blood pressure is  depressed, therefore just Lasix has been ordered.  Also has increased oxygen requirement 2:12 AM Discussed with Dr. Clyde Lundborg for admission.  Patient will be admitted for diuresis, and monitoring  Final Clinical Impressions(s) / ED Diagnoses   Final diagnoses:  Acute systolic congestive heart failure (HCC)  AKI (acute kidney injury) [facility])    ED Discharge Orders    None       Zadie Rhine, MD 2018-04-04 [phone]

## 2018-03-27 NOTE — ED Notes (Signed)
Patient transported to X-ray 

## 2018-03-28 ENCOUNTER — Encounter (HOSPITAL_COMMUNITY): Payer: Self-pay | Admitting: Internal Medicine

## 2018-03-28 ENCOUNTER — Emergency Department (HOSPITAL_COMMUNITY): Payer: PPO

## 2018-03-28 ENCOUNTER — Inpatient Hospital Stay (HOSPITAL_COMMUNITY): Payer: PPO

## 2018-03-28 DIAGNOSIS — I248 Other forms of acute ischemic heart disease: Secondary | ICD-10-CM | POA: Diagnosis present

## 2018-03-28 DIAGNOSIS — I252 Old myocardial infarction: Secondary | ICD-10-CM | POA: Diagnosis not present

## 2018-03-28 DIAGNOSIS — Z880 Allergy status to penicillin: Secondary | ICD-10-CM | POA: Diagnosis not present

## 2018-03-28 DIAGNOSIS — I2510 Atherosclerotic heart disease of native coronary artery without angina pectoris: Secondary | ICD-10-CM | POA: Diagnosis present

## 2018-03-28 DIAGNOSIS — K219 Gastro-esophageal reflux disease without esophagitis: Secondary | ICD-10-CM

## 2018-03-28 DIAGNOSIS — Z9981 Dependence on supplemental oxygen: Secondary | ICD-10-CM | POA: Diagnosis not present

## 2018-03-28 DIAGNOSIS — I469 Cardiac arrest, cause unspecified: Secondary | ICD-10-CM | POA: Diagnosis not present

## 2018-03-28 DIAGNOSIS — I509 Heart failure, unspecified: Secondary | ICD-10-CM

## 2018-03-28 DIAGNOSIS — N183 Chronic kidney disease, stage 3 (moderate): Secondary | ICD-10-CM | POA: Diagnosis present

## 2018-03-28 DIAGNOSIS — N179 Acute kidney failure, unspecified: Secondary | ICD-10-CM | POA: Diagnosis present

## 2018-03-28 DIAGNOSIS — J9621 Acute and chronic respiratory failure with hypoxia: Secondary | ICD-10-CM | POA: Diagnosis present

## 2018-03-28 DIAGNOSIS — J441 Chronic obstructive pulmonary disease with (acute) exacerbation: Secondary | ICD-10-CM | POA: Diagnosis present

## 2018-03-28 DIAGNOSIS — R0902 Hypoxemia: Secondary | ICD-10-CM | POA: Diagnosis present

## 2018-03-28 DIAGNOSIS — L03116 Cellulitis of left lower limb: Secondary | ICD-10-CM

## 2018-03-28 DIAGNOSIS — Z8249 Family history of ischemic heart disease and other diseases of the circulatory system: Secondary | ICD-10-CM | POA: Diagnosis not present

## 2018-03-28 DIAGNOSIS — Z7982 Long term (current) use of aspirin: Secondary | ICD-10-CM | POA: Diagnosis not present

## 2018-03-28 DIAGNOSIS — I10 Essential (primary) hypertension: Secondary | ICD-10-CM | POA: Diagnosis not present

## 2018-03-28 DIAGNOSIS — R7989 Other specified abnormal findings of blood chemistry: Secondary | ICD-10-CM | POA: Diagnosis present

## 2018-03-28 DIAGNOSIS — I130 Hypertensive heart and chronic kidney disease with heart failure and stage 1 through stage 4 chronic kidney disease, or unspecified chronic kidney disease: Secondary | ICD-10-CM | POA: Diagnosis present

## 2018-03-28 DIAGNOSIS — Z79899 Other long term (current) drug therapy: Secondary | ICD-10-CM | POA: Diagnosis not present

## 2018-03-28 DIAGNOSIS — I5041 Acute combined systolic (congestive) and diastolic (congestive) heart failure: Secondary | ICD-10-CM

## 2018-03-28 DIAGNOSIS — R748 Abnormal levels of other serum enzymes: Secondary | ICD-10-CM

## 2018-03-28 DIAGNOSIS — Z87891 Personal history of nicotine dependence: Secondary | ICD-10-CM | POA: Diagnosis not present

## 2018-03-28 DIAGNOSIS — R778 Other specified abnormalities of plasma proteins: Secondary | ICD-10-CM | POA: Diagnosis present

## 2018-03-28 LAB — CBC WITH DIFFERENTIAL/PLATELET
BASOS ABS: 0 10*3/uL (ref 0.0–0.1)
Basophils Relative: 0 %
Eosinophils Absolute: 0 10*3/uL (ref 0.0–0.7)
Eosinophils Relative: 0 %
HEMATOCRIT: 38.8 % — AB (ref 39.0–52.0)
Hemoglobin: 11.8 g/dL — ABNORMAL LOW (ref 13.0–17.0)
LYMPHS PCT: 14 %
Lymphs Abs: 1.6 10*3/uL (ref 0.7–4.0)
MCH: 25.9 pg — ABNORMAL LOW (ref 26.0–34.0)
MCHC: 30.4 g/dL (ref 30.0–36.0)
MCV: 85.3 fL (ref 78.0–100.0)
MONO ABS: 1.4 10*3/uL — AB (ref 0.1–1.0)
MONOS PCT: 12 %
NEUTROS ABS: 8.5 10*3/uL — AB (ref 1.7–7.7)
NEUTROS PCT: 74 %
Platelets: 138 10*3/uL — ABNORMAL LOW (ref 150–400)
RBC: 4.55 MIL/uL (ref 4.22–5.81)
RDW: 16.7 % — AB (ref 11.5–15.5)
WBC: 11.4 10*3/uL — ABNORMAL HIGH (ref 4.0–10.5)

## 2018-03-28 LAB — POCT I-STAT 3, ART BLOOD GAS (G3+)
Acid-Base Excess: 5 mmol/L — ABNORMAL HIGH (ref 0.0–2.0)
BICARBONATE: 34.9 mmol/L — AB (ref 20.0–28.0)
O2 Saturation: 95 %
PO2 ART: 98 mmHg (ref 83.0–108.0)
Patient temperature: 98.6
TCO2: 38 mmol/L — ABNORMAL HIGH (ref 22–32)
pCO2 arterial: 87 mmHg (ref 32.0–48.0)
pH, Arterial: 7.211 — ABNORMAL LOW (ref 7.350–7.450)

## 2018-03-28 LAB — BASIC METABOLIC PANEL
ANION GAP: 17 — AB (ref 5–15)
Anion gap: 23 — ABNORMAL HIGH (ref 5–15)
BUN: 63 mg/dL — AB (ref 6–20)
BUN: 66 mg/dL — ABNORMAL HIGH (ref 6–20)
CHLORIDE: 91 mmol/L — AB (ref 101–111)
CO2: 22 mmol/L (ref 22–32)
CO2: 32 mmol/L (ref 22–32)
CREATININE: 2.14 mg/dL — AB (ref 0.61–1.24)
Calcium: 8.5 mg/dL — ABNORMAL LOW (ref 8.9–10.3)
Calcium: 9.6 mg/dL (ref 8.9–10.3)
Chloride: 90 mmol/L — ABNORMAL LOW (ref 101–111)
Creatinine, Ser: 1.91 mg/dL — ABNORMAL HIGH (ref 0.61–1.24)
GFR calc Af Amer: 31 mL/min — ABNORMAL LOW (ref >60)
GFR calc non Af Amer: 27 mL/min — ABNORMAL LOW (ref >60)
GFR calc non Af Amer: 31 mL/min — ABNORMAL LOW (ref >60)
GFR, EST AFRICAN AMERICAN: 36 mL/min — AB (ref >60)
GLUCOSE: 184 mg/dL — AB (ref 65–99)
GLUCOSE: 186 mg/dL — AB (ref 65–99)
POTASSIUM: 4.9 mmol/L (ref 3.5–5.1)
Potassium: 6.9 mmol/L (ref 3.5–5.1)
SODIUM: 136 mmol/L (ref 135–145)
Sodium: 139 mmol/L (ref 135–145)

## 2018-03-28 LAB — URINALYSIS, ROUTINE W REFLEX MICROSCOPIC
Bilirubin Urine: NEGATIVE
Glucose, UA: NEGATIVE mg/dL
HGB URINE DIPSTICK: NEGATIVE
Ketones, ur: NEGATIVE mg/dL
LEUKOCYTES UA: NEGATIVE
Nitrite: NEGATIVE
PROTEIN: NEGATIVE mg/dL
Specific Gravity, Urine: 1.016 (ref 1.005–1.030)
pH: 5 (ref 5.0–8.0)

## 2018-03-28 LAB — TROPONIN I
Troponin I: 0.06 ng/mL (ref <0.03)
Troponin I: 0.07 ng/mL (ref <0.03)
Troponin I: 0.15 ng/mL (ref <0.03)

## 2018-03-28 LAB — LIPID PANEL
CHOLESTEROL: 119 mg/dL (ref 0–200)
HDL: 18 mg/dL — AB (ref >40)
LDL CALC: 78 mg/dL (ref 0–99)
TRIGLYCERIDES: 115 mg/dL (ref <150)
Total CHOL/HDL Ratio: 6.6 RATIO
VLDL: 23 mg/dL (ref 0–40)

## 2018-03-28 LAB — GLUCOSE, CAPILLARY: Glucose-Capillary: 188 mg/dL — ABNORMAL HIGH (ref 65–99)

## 2018-03-28 LAB — HEMOGLOBIN A1C
HEMOGLOBIN A1C: 7.8 % — AB (ref 4.8–5.6)
Mean Plasma Glucose: 177.16 mg/dL

## 2018-03-28 LAB — MAGNESIUM: MAGNESIUM: 2.1 mg/dL (ref 1.7–2.4)

## 2018-03-28 LAB — CREATININE, URINE, RANDOM: Creatinine, Urine: 115.35 mg/dL

## 2018-03-28 LAB — BRAIN NATRIURETIC PEPTIDE: B NATRIURETIC PEPTIDE 5: 2751.4 pg/mL — AB (ref 0.0–100.0)

## 2018-03-28 MED ORDER — FUROSEMIDE 10 MG/ML IJ SOLN
40.0000 mg | Freq: Once | INTRAMUSCULAR | Status: AC
  Administered 2018-03-28: 40 mg via INTRAVENOUS
  Filled 2018-03-28: qty 4

## 2018-03-28 MED ORDER — PANTOPRAZOLE SODIUM 40 MG PO TBEC: 40.0000 mg | DELAYED_RELEASE_TABLET | Freq: Every day | ORAL | Status: DC

## 2018-03-28 MED ORDER — DM-GUAIFENESIN ER 30-600 MG PO TB12: 1.0000 | ORAL_TABLET | Freq: Two times a day (BID) | ORAL | Status: DC | PRN

## 2018-03-28 MED ORDER — FUROSEMIDE 10 MG/ML IJ SOLN: 40.0000 mg | Freq: Two times a day (BID) | INTRAMUSCULAR | Status: DC

## 2018-03-28 MED ORDER — IPRATROPIUM-ALBUTEROL 0.5-2.5 (3) MG/3ML IN SOLN
3.0000 mL | RESPIRATORY_TRACT | Status: DC
  Administered 2018-03-28: 3 mL via RESPIRATORY_TRACT
  Filled 2018-03-28 (×2): qty 3

## 2018-03-28 MED ORDER — EPINEPHRINE PF 1 MG/ML IJ SOLN
0.5000 ug/min | INTRAVENOUS | Status: DC
  Filled 2018-03-28: qty 4

## 2018-03-28 MED ORDER — SODIUM CHLORIDE 0.9% FLUSH: 3.0000 mL | Freq: Two times a day (BID) | INTRAVENOUS | Status: DC

## 2018-03-28 MED ORDER — ENOXAPARIN SODIUM 40 MG/0.4ML ~~LOC~~ SOLN: 40.0000 mg | SUBCUTANEOUS | Status: DC

## 2018-03-28 MED ORDER — HYDROXYZINE HCL 10 MG PO TABS: 10.0000 mg | ORAL_TABLET | Freq: Three times a day (TID) | ORAL | Status: DC | PRN

## 2018-03-28 MED ORDER — SODIUM CHLORIDE 0.9 % IV SOLN: 250.0000 mL | INTRAVENOUS | Status: DC | PRN

## 2018-03-28 MED ORDER — IPRATROPIUM BROMIDE 0.02 % IN SOLN
0.5000 mg | Freq: Once | RESPIRATORY_TRACT | Status: AC
  Administered 2018-03-28: 0.5 mg via RESPIRATORY_TRACT
  Filled 2018-03-28: qty 2.5

## 2018-03-28 MED ORDER — SODIUM CHLORIDE 0.9% FLUSH: 3.0000 mL | INTRAVENOUS | Status: DC | PRN

## 2018-03-28 MED ORDER — SODIUM CHLORIDE 0.9 % IV BOLUS: 250.0000 mL | Freq: Once | INTRAVENOUS | Status: DC

## 2018-03-28 MED ORDER — LORAZEPAM 2 MG/ML IJ SOLN
0.5000 mg | Freq: Once | INTRAMUSCULAR | Status: DC
  Filled 2018-03-28: qty 1

## 2018-03-28 MED ORDER — ACETAMINOPHEN 325 MG PO TABS: 650.0000 mg | ORAL_TABLET | ORAL | Status: DC | PRN

## 2018-03-28 MED ORDER — CARVEDILOL 3.125 MG PO TABS: 3.1250 mg | ORAL_TABLET | Freq: Two times a day (BID) | ORAL | Status: DC

## 2018-03-28 MED ORDER — ALBUTEROL SULFATE (2.5 MG/3ML) 0.083% IN NEBU
5.0000 mg | INHALATION_SOLUTION | RESPIRATORY_TRACT | Status: DC | PRN
  Administered 2018-03-28: 5 mg via RESPIRATORY_TRACT
  Filled 2018-03-28: qty 6

## 2018-03-28 MED ORDER — UMECLIDINIUM-VILANTEROL 62.5-25 MCG/INH IN AEPB
1.0000 | INHALATION_SPRAY | Freq: Every day | RESPIRATORY_TRACT | Status: DC
  Filled 2018-03-28: qty 14

## 2018-03-28 MED ORDER — HYDRALAZINE HCL 20 MG/ML IJ SOLN: 5.0000 mg | INTRAMUSCULAR | Status: DC | PRN

## 2018-03-28 MED ORDER — ALBUTEROL SULFATE (2.5 MG/3ML) 0.083% IN NEBU
5.0000 mg | INHALATION_SOLUTION | Freq: Once | RESPIRATORY_TRACT | Status: AC
  Administered 2018-03-28: 5 mg via RESPIRATORY_TRACT
  Filled 2018-03-28: qty 6

## 2018-03-28 MED ORDER — ZOLPIDEM TARTRATE 5 MG PO TABS: 5.0000 mg | ORAL_TABLET | Freq: Every evening | ORAL | Status: DC | PRN

## 2018-03-28 MED ORDER — ASPIRIN EC 325 MG PO TBEC: 325.0000 mg | DELAYED_RELEASE_TABLET | Freq: Every day | ORAL | Status: DC

## 2018-03-28 MED ORDER — ONDANSETRON HCL 4 MG/2ML IJ SOLN: 4.0000 mg | Freq: Four times a day (QID) | INTRAMUSCULAR | Status: DC | PRN

## 2018-03-29 LAB — UREA NITROGEN, URINE: Urea Nitrogen, Ur: 1054 mg/dL

## 2018-03-31 ENCOUNTER — Telehealth: Payer: Self-pay

## 2018-03-31 NOTE — Telephone Encounter (Signed)
On 03/31/18 I received a d/c from [employer] & [facility]). The d/c is for burial.   The patient is a patient of Doctor Hammonds. The d/c will be taken to [facility] for signature.  On 04/02/18 I received the d/c back from Doctor Craige Cotta who signed the d/c for Doctor Hammonds.  I got the d/c ready and called the funeral home to let them know the d/c was mailed to vital records per the funeral home request.

## 2018-04-02 MED FILL — Medication: Qty: 1 | Status: AC

## 2018-04-02 MED FILL — Medication: Qty: 1 | Status: CN

## 2018-04-25 NOTE — Progress Notes (Signed)
[facility] notified of pt's passing, spoke with [place]. Referral # [id number].

## 2018-04-25 NOTE — Death Summary Note (Signed)
DEATH SUMMARY   Patient Details  Name: Robert Maynard MRN: [id number] DOB: 1933-10-02  Admission/Discharge Information   Admit Date:  Apr 18, 2018  Date of Death:  19-Apr-2018  Time of Death:  6:35am  Length of Stay: 0  Referring Physician: Homero Fellers, FNP   Reason(s) for Hospitalization  Shortness of Breath  Diagnoses  Preliminary cause of death:  Secondary Diagnoses (including complications and co-morbidities):  Principal Problem:   Acute on chronic respiratory failure with hypoxia (HCC) Active Problems:   Acute combined systolic and diastolic congestive heart failure (HCC)   Cellulitis of left lower extremity   Acute renal failure superimposed on stage 3 chronic kidney disease (HCC)   Essential hypertension   GERD (gastroesophageal reflux disease)   CAD (coronary artery disease)   Elevated troponin   COPD with acute exacerbation (HCC)   CHF exacerbation [facility])  Brief Hospital Course (including significant findings, care, treatment, and services provided and events leading to death)  Robert Maynard is a 84 y.o. year old male with history of CAD, CHF (EF 20%), CKD, COPD, Chronic Hypoxia (on 2L O2), GERD, and HTN, who is s/p admission 4/23-4/28 for CHF exacerbation and LE edema, now presented to the ER on 04/18/2018 PM c/o Shortness of breath.   Patient was recently hospitalized from 4/23-4/28 due to CHF exacerbation.  Patient was discharged on Lasix 80 mg twice daily.  He also had left lower extremity cellulitis, and discharged on doxycycline for 3 days.  Patient states that his leg infection has improved, but has developed worsening shortness of breath in the past 2 days. His leg edema has also worsened. He does not have chest pain fever or chills.  He has cough with yellow-colored mucus production.  No runny nose or sore throat.  Patient states that he has nausea and loose stool bowel movement twice today.  No vomiting or abdominal pain.  Patient denies symptoms of UTI or unilateral  weakness.  He has a generalized weakness and poor appetite.  On ER workup he was thought to have a CHF exacerbation, COPD exacerbation, and AKI-on-CKD and was admitted to a telemetry bed by the hospitalist service. He went into cardiac arrest at 5:25am and CPR was initiated with ROSC achieved. Patient was intubated and transferred to the ICU where he had another 4 cardiac arrests, during the last of which he was unable to be resuscitated despite maximal attempts with CPR. Time of death 6:35am.   Pertinent Labs and Studies  Significant Diagnostic Studies Dg Chest 2 View  Result Date: 04-19-2018 CLINICAL DATA:  Shortness of breath EXAM: CHEST - 2 VIEW COMPARISON:  03/17/2018, 10/17/2015 FINDINGS: Trace pleural effusions. Cardiomegaly with vascular congestion. Post sternotomy changes. Patchy atelectasis at the left base. Aortic atherosclerosis. No pneumothorax. IMPRESSION: 1. Cardiomegaly with vascular congestion and minimal interstitial edema 2. Trace pleural effusion.  Streaky atelectasis at the left base. Electronically Signed   By: Jasmine Pang M.D.   On: 04/19/2018 00:13   Dg Chest 2 View  Result Date: 03/17/2018 CLINICAL DATA:  Two week history of increased shortness of breath and bilateral lower extremity edema. EXAM: CHEST - 2 VIEW COMPARISON:  None. FINDINGS: Postoperative changes in the mediastinum. Fractures of 2 superior sternotomy wires. Diffuse cardiac enlargement. Mild pulmonary vascular congestion. Slight interstitial pattern in the lung bases likely due to edema. Small bilateral pleural effusions. No focal consolidation. No pneumothorax. Calcified and tortuous aorta. Degenerative changes in the spine. IMPRESSION: 1. Congestive changes with cardiac enlargement, pulmonary vascular  congestion, interstitial edema, and bilateral pleural effusions. 2. Aortic atherosclerosis. Electronically Signed   By: Burman Nieves M.D.   On: 03/17/2018 22:27   Dg Chest Port 1 View  Result Date:  04/04/2018 CLINICAL DATA:  84 y/o  M; status post CPR, found unresponsive. EXAM: PORTABLE CHEST 1 VIEW COMPARISON:  12-Apr-2018 chest radiograph. FINDINGS: Enteric tube tip 2.5 cm above carina. Transcutaneous pacing pads noted. Post median sternotomy and CABG with several stable broken upper wires. Stable cardiomegaly. No acute pulmonary process identified. IMPRESSION: Enteric tube tip 2.5 cm above carina.  Cardiomegaly. Electronically Signed   By: Mitzi Hansen M.D.   On: 03/30/2018 06:23    Microbiology Recent Results (from the past 240 hour(s))  Culture, blood (Routine X 2) w Reflex to ID Panel     Status: None   Collection Time: 03/18/18 11:50 AM  Result Value Ref Range Status   Specimen Description BLOOD RIGHT HAND  Final   Special Requests   Final    BOTTLES DRAWN AEROBIC AND ANAEROBIC Blood Culture adequate volume   Culture   Final    NO GROWTH 5 DAYS Performed at [facility], [address] [place], [place] [address]    Report Status 03/23/2018 FINAL  Final  Culture, blood (Routine X 2) w Reflex to ID Panel     Status: None   Collection Time: 03/18/18 12:06 PM  Result Value Ref Range Status   Specimen Description BLOOD LEFT ANTECUBITAL  Final   Special Requests   Final    BOTTLES DRAWN AEROBIC AND ANAEROBIC Blood Culture adequate volume   Culture   Final    NO GROWTH 5 DAYS Performed at [facility], [address] [place], [place] [address]    Report Status 03/23/2018 FINAL  Final    Lab Basic Metabolic Panel: Recent Labs  Lab 03/21/18 0756 03/21/18 0912 03/22/18 0341 2018/04/12 2341  NA 137  --  137 139  K 4.2  --  4.4 4.9  CL 92*  --  92* 90*  CO2 36*  --  38* 32  GLUCOSE 123*  --  183* 186*  BUN 19  --  29* 66*  CREATININE 1.29*  --  1.49* 1.91*  CALCIUM 8.8*  --  8.8* 9.6  MG  --  1.7 2.0  --    Liver Function Tests: No results for input(s): AST, ALT, ALKPHOS, BILITOT, PROT, ALBUMIN in the last 168 hours. No results for input(s):  LIPASE, AMYLASE in the last 168 hours. No results for input(s): AMMONIA in the last 168 hours. CBC: Recent Labs  Lab 03/21/18 0756 03/22/18 0341 04/12/2018 2341  WBC 6.4 5.9 11.4*  NEUTROABS  --   --  8.5*  HGB 9.7* 9.5* 11.8*  HCT 32.7* 32.5* 38.8*  MCV 88.9 88.8 85.3  PLT 111* 133* 138*   Cardiac Enzymes: Recent Labs  Lab 2018-04-12 2341 04/04/2018 0243  TROPONINI 0.06* 0.07*   Sepsis Labs: Recent Labs  Lab 03/21/18 0756 03/22/18 0341 2018-04-12 2341  WBC 6.4 5.9 11.4*    Procedures/Operations  Intubation CPR   Nicholos Johns H Hammonds 03/25/2018, 6:52 AM

## 2018-04-25 NOTE — Progress Notes (Signed)
Went to patient's room over hearing patient calling out from room, pateint wsa gasping for breath diaphoretic, eyes open but not responding to questions.  Patient had removed oxygen mask.  Patient begun agonal breathing. Patient then closed eyes and heart rate dropped to 55. Pulled code blue button. Began bagging patient O2 stats 98%. Chest compressions began per ACLS protocol. Code team arrived. See code blue record.

## 2018-04-25 NOTE — Code Documentation (Signed)
Patient had respiratory and cardiac arrest on medicine floor, requiring CPR for 15 minutes (1610-9604) before ROSC attained. Patient was intubated, following which he had another cardiac arrest requiring CPR for 6 minutes ([id number]), with ROSC attained. Patient moved to ICU where he again had multiple cardiac arrests (PEA) requiring CPR (06:01-06:03, 06:17-06:26, 06:29-06:35), with ROSC attained. During the last cardiac arrest, he was unable to be resuscitated Time of death 6:35am. Full details of code are listed on code sheet. Chaplain present at code. I spoke to patient's daughter over the phone and provided support.

## 2018-04-25 NOTE — Progress Notes (Signed)
Code blue was called on the patient this morning. Upon arrival the CPR was in progress and the patient was being bagged with BVM and 100% o2. Anesthesia intubated the patient with a 7.5 ETT at 23cm at the lip. Upon ROSC the patient was subsequently transported to [facility] room 4. Report was given to Bennett Scrape, RRT.

## 2018-04-25 NOTE — Progress Notes (Signed)
Patient coded numerous times and passed away.  Daughter (dont know which one) on phone and after death, Dr. Sherron Monday with her and shared Arville Lime had died.Family not available in house.  Was prepared to [employer] prayer and presence but family not here. Phebe Colla, Chaplain   04/20/2018 0600  Clinical Encounter Type  Visited With Patient not available  Visit Type Code  Referral From Care management  Consult/Referral To Chaplain  Spiritual Encounters  Spiritual Needs Other (Comment) (no family here)  Stress Factors  Patient Stress Factors None identified  Family Stress Factors Loss

## 2018-04-25 NOTE — Progress Notes (Signed)
Around 0500, ativan was pulled from pyxis to give to pt. However, pt coded before administration. Vial of ativan wasted with Adrienne in main med room.   Judithann Sheen, RN

## 2018-04-25 NOTE — Code Documentation (Signed)
CODE BLUE NOTE  Patient Name: Robert Maynard   MRN: [id number]   Date of Birth/ Sex: November 26, 1932 , male      Admission Date: Apr 05, 2018  Attending Provider: Alwyn Ren, MD  Primary Diagnosis: Acute on chronic respiratory failure with hypoxia [facility])    Indication: Pt was in his usual state of health until this PM, when he was noted to be unresponsive desating in 80s after removal of oxygen and bradying down int to 50s. Code blue was subsequently called. At the time of arrival on scene, ACLS protocol was underway.    Technical Description:  - CPR performance duration:   minute  - Was defibrillation or cardioversion used? No   - Was external pacer placed? No  - Was patient intubated pre/post CPR? Post CPR    Medications Administered: Y = Yes; Blank = No Amiodarone    Atropine    Calcium    Epinephrine  y  Lidocaine    Magnesium    Norepinephrine    Phenylephrine    Sodium bicarbonate    Vasopressin      Post CPR evaluation:  - Final Status - Was patient successfully resuscitated ? Yes, Pt w - What is current rhythm? Sins tachycardia HR 120s - What is current hemodynamic status? 83/60   Miscellaneous Information:  - Labs sent, including: Abg, mg, bmp, cxr  - Primary team notified?  Yes  - Family Notified? No  - Additional notes/ transfer status: None        Garnette Gunner, MD  03/29/2018, 5:38 AM

## 2018-04-25 NOTE — H&P (Addendum)
History and Physical    Ustin Cruickshank Postell ZOX:[id number] DOB: Dec 18, 1932 DOA: 03/29/2018  Referring MD/NP/PA:   PCP: Homero Fellers, FNP   Patient coming from:  The patient is coming from home.  At baseline, pt is partially dependent for most of ADL.  Chief Complaint: SOB  HPI: RY MOODY is a 84 y.o. male with medical history significant of CHF with EF 20%, hypertension, COPD on 2L oxygen at home, GERD, CKD-3, CAD, who presents with shortness of breath.  Patient was recently hospitalized from 4/23-4/28 due to CHF exacerbation.  Patient was discharged on Lasix 80 mg twice daily.  He also had left lower extremity cellulitis, and discharged on doxycycline for 3 days.  Patient states that his leg infection has improved, but has developed worsening shortness of breath in the past 2 days. His leg edema has also worsened. He does not have chest pain fever or chills.  He has cough with yellow-colored mucus production.  No runny nose or sore throat.  Patient states that he has nausea and loose stool bowel movement twice today.  No vomiting or abdominal pain.  Patient denies symptoms of UTI or unilateral weakness.  He has a generalized weakness and poor appetite  ED Course: pt was found to have WBC 11.4, troponin 0.06, BNP 2751, negative urinalysis, worsening renal function, temperature normal, soft blood pressure, tachypnea, oxygen saturation 85% on room air.  Chest x-ray showed vascular congestion.  Patient is admitted to telemetry bed as inpatient.  Review of Systems:   General: no fevers, chills, has poor appetite, has fatigue HEENT: no blurry vision, hearing changes or sore throat Respiratory: has dyspnea, coughing, no wheezing CV: no chest pain, no palpitations GI: has nausea and loose stool, no vomiting, abdominal pain, constipation GU: no dysuria, burning on urination, increased urinary frequency, hematuria  Ext: has leg edema Neuro: no unilateral weakness, numbness, or tingling, no  vision change or hearing loss Skin: no rash, no skin tear. MSK: No muscle spasm, no deformity, no limitation of range of movement in spin Heme: No easy bruising.  Travel history: No recent long distant travel.  Allergy:  Allergies  Allergen Reactions  . Penicillins Rash    Past Medical History:  Diagnosis Date  . Acute pulmonary edema (HCC) 03/18/2018  . Asthma   . CHF (congestive heart failure) (HCC)   . CKD (chronic kidney disease), stage III (HCC)   . Hypertension   . MI (myocardial infarction) [facility])     Past Surgical History:  Procedure Laterality Date  . CARDIAC SURGERY      Social History:  reports that he has quit smoking. His smoking use included cigarettes. He has a 60.00 pack-year smoking history. His smokeless tobacco use includes chew. He reports that he does not drink alcohol or use drugs.  Family History:  Family History  Problem Relation Age of Onset  . Heart disease Father      Prior to Admission medications   Medication Sig Start Date End Date Taking? Authorizing Provider  aspirin EC 325 MG tablet Take 325 mg by mouth daily.   Yes [provider]  carvedilol (COREG) 3.125 MG tablet Take 1 tablet (3.125 mg total) by mouth 2 (two) times daily with a meal. 03/22/18  Yes Noralee Stain, DO  furosemide (LASIX) 40 MG tablet Take 2 tablets (80 mg total) by mouth 2 (two) times daily. 03/22/18 04/21/18 Yes Noralee Stain, DO  pantoprazole (PROTONIX) 40 MG tablet Take 40 mg by mouth daily.  Yes [provider]  potassium chloride (KLOR-CON 10) 10 MEQ tablet Take 1 tablet (10 mEq total) by mouth 2 (two) times daily. 03/22/18  Yes Noralee Stain, DO  umeclidinium-vilanterol (ANORO ELLIPTA) 62.5-25 MCG/INH AEPB Inhale 1 puff into the lungs daily.   Yes [provider]    Physical Exam: Vitals:   04/21/2018 0000 03/27/2018 0015 04/12/2018 0100 04/11/2018 0200  BP: 104/69 106/72 110/72 (!) 106/59  Pulse: 85 86 85 85  Resp: (!) 24 (!) 30 (!) 21 (!)  26  Temp:      TempSrc:      SpO2: 92% 90% 91% 91%  Weight:      Height:       General: Not in acute distress HEENT:       Eyes: PERRL, EOMI, no scleral icterus.       ENT: No discharge from the ears and nose, no pharynx injection, no tonsillar enlargement.        Neck: postive JVD, no bruit, no mass felt. Heme: No neck lymph node enlargement. Cardiac: S1/S2, RRR, No murmurs, No gallops or rubs. Respiratory: has rhonchi bilaterally. GI: Soft, nondistended, nontender, no rebound pain, no organomegaly, BS present. GU: No hematuria Ext: 3+ pitting leg edema bilaterally. 2+DP/PT pulse bilaterally. Musculoskeletal: No joint deformities, No joint redness or warmth, no limitation of ROM in spin. Skin: No rashes.  Neuro: Alert, oriented X3, cranial nerves II-XII grossly intact, moves all extremities normally.  Psych: Patient is not psychotic, no suicidal or hemocidal ideation.  Labs on Admission: I have personally reviewed following labs and imaging studies  CBC: Recent Labs  Lab 03/21/18 0756 03/22/18 0341 04-24-18 2341  WBC 6.4 5.9 11.4*  NEUTROABS  --   --  8.5*  HGB 9.7* 9.5* 11.8*  HCT 32.7* 32.5* 38.8*  MCV 88.9 88.8 85.3  PLT 111* 133* 138*   Basic Metabolic Panel: Recent Labs  Lab 03/21/18 0756 03/21/18 0912 03/22/18 0341 04-24-18 2341  NA 137  --  137 139  K 4.2  --  4.4 4.9  CL 92*  --  92* 90*  CO2 36*  --  38* 32  GLUCOSE 123*  --  183* 186*  BUN 19  --  29* 66*  CREATININE 1.29*  --  1.49* 1.91*  CALCIUM 8.8*  --  8.8* 9.6  MG  --  1.7 2.0  --    GFR: Estimated Creatinine Clearance: 36.9 mL/min (A) (by C-G formula based on SCr of 1.91 mg/dL (H)). Liver Function Tests: No results for input(s): AST, ALT, ALKPHOS, BILITOT, PROT, ALBUMIN in the last 168 hours. No results for input(s): LIPASE, AMYLASE in the last 168 hours. No results for input(s): AMMONIA in the last 168 hours. Coagulation Profile: No results for input(s): INR, PROTIME in the last 168  hours. Cardiac Enzymes: Recent Labs  Lab 2018-04-24 2341  TROPONINI 0.06*   BNP (last 3 results) No results for input(s): PROBNP in the last 8760 hours. HbA1C: No results for input(s): HGBA1C in the last 72 hours. CBG: No results for input(s): GLUCAP in the last 168 hours. Lipid Profile: No results for input(s): CHOL, HDL, LDLCALC, TRIG, CHOLHDL, LDLDIRECT in the last 72 hours. Thyroid Function Tests: No results for input(s): TSH, T4TOTAL, FREET4, T3FREE, THYROIDAB in the last 72 hours. Anemia Panel: No results for input(s): VITAMINB12, FOLATE, FERRITIN, TIBC, IRON, RETICCTPCT in the last 72 hours. Urine analysis:    Component Value Date/Time   COLORURINE YELLOW 04/24/2018 2353   APPEARANCEUR HAZY (  A) 04/09/2018 2353   LABSPEC 1.016 04/17/2018 2353   PHURINE 5.0 04/15/2018 2353   GLUCOSEU NEGATIVE 04/14/2018 2353   HGBUR NEGATIVE 04/08/2018 2353   BILIRUBINUR NEGATIVE 04/24/2018 2353   KETONESUR NEGATIVE 04/03/2018 2353   PROTEINUR NEGATIVE 04/08/2018 2353   NITRITE NEGATIVE 03/26/2018 2353   LEUKOCYTESUR NEGATIVE 04/11/2018 2353   Sepsis Labs: (procalcitonin:4,lacticidven:4) ) Recent Results (from the past 240 hour(s))  Culture, blood (Routine X 2) w Reflex to ID Panel     Status: None   Collection Time: 03/18/18 11:50 AM  Result Value Ref Range Status   Specimen Description BLOOD RIGHT HAND  Final   Special Requests   Final    BOTTLES DRAWN AEROBIC AND ANAEROBIC Blood Culture adequate volume   Culture   Final    NO GROWTH 5 DAYS Performed at [facility], [address], [place], [place] [address]    Report Status 03/23/2018 FINAL  Final  Culture, blood (Routine X 2) w Reflex to ID Panel     Status: None   Collection Time: 03/18/18 12:06 PM  Result Value Ref Range Status   Specimen Description BLOOD LEFT ANTECUBITAL  Final   Special Requests   Final    BOTTLES DRAWN AEROBIC AND ANAEROBIC Blood Culture adequate volume   Culture   Final    NO  GROWTH 5 DAYS Performed at [facility], [address], [place], [place] [address]    Report Status 03/23/2018 FINAL  Final     Radiological Exams on Admission: Dg Chest 2 View  Result Date: Apr 01, 2018 CLINICAL DATA:  Shortness of breath EXAM: CHEST - 2 VIEW COMPARISON:  03/17/2018, 10/17/2015 FINDINGS: Trace pleural effusions. Cardiomegaly with vascular congestion. Post sternotomy changes. Patchy atelectasis at the left base. Aortic atherosclerosis. No pneumothorax. IMPRESSION: 1. Cardiomegaly with vascular congestion and minimal interstitial edema 2. Trace pleural effusion.  Streaky atelectasis at the left base. Electronically Signed   By: Jasmine Pang M.D.   On: 04-01-18 00:13     EKG: Independently reviewed.  Sinus rhythm, bifascicular block, early R wave progression  Assessment/Plan Principal Problem:   Acute on chronic respiratory failure with hypoxia (HCC) Active Problems:   Acute combined systolic and diastolic congestive heart failure (HCC)   Cellulitis of left lower extremity   Acute renal failure superimposed on stage 3 chronic kidney disease (HCC)   Essential hypertension   GERD (gastroesophageal reflux disease)   CAD (coronary artery disease)   Elevated troponin   COPD with acute exacerbation (HCC)   CHF exacerbation (HCC)  Acute on chronic respiratory failure with hypoxia (HCC): Likely due to combination of CHF and COPD exacerbation.  Patient has a 3+ leg edema, elevated BNP, vascular congestion on chest x-ray, consistent with CHF exacerbation.  He has productive cough, rhonchi on auscultation, indicating COPD exacerbation.  -will admit patient to telemetry bed  -Nasal cannula oxygen as needed to maintain O2 saturation 92% or greater -treat COPD and CHF exaceration as below -consult CM and SW for possible SNF placement  Acute combined systolic and diastolic congestive heart failure: 2D echo on 03/18/2018 showed EF 20-25% with grade 2 diastolic dysfunction.   Now has recurrent CHF exacerbation. -Lasix 40 mg bid by IV -2d echo -Daily weights -strict I/O's -Low salt diet  COPD exacerbation: -Nebulizers: scheduled Duoneb and prn albuterol -continue home Anoro Ellipta -Mucinex for cough  -Incentive spirometry -get sputum culture  Elevated troponin and hx of CAD: Troponin 0.06.  Patient does not have  chest pain. Likely due to demand ischemia secondary to CHF exacerbation. -Continue aspirin, Coreg -Check A1c, FLP -Troponin x3 -Follow-up 2D echo  Hx of Cellulitis of left lower extremity: treated in recent admission, and completed 3-day course of doxycycline at home.  Improved.  Has mild leukocytosis with WBC 11.4, but no fever. -Observe for now  Acute renal failure superimposed on stage 3 chronic kidney disease (HCC): Baseline creatinine 1.1-1.2.  His creatinine is 1.91, BUN 66.  Likely due to cardiorenal syndrome. -Follow-up renal function with BMP -Avoid renal toxic medication  HTN: Blood pressure is a soft now -Continue home medications: Coreg -IV hydralazine prn  GERD: -Protonix  DVT ppx: SQ Lovenox Code Status: Full code (I discussed with patient in the presence of her daughter, and explained the meaning of CODE STATUS. Patient wants to be full code). Family Communication:  Yes, patient's daugher at bed side Disposition Plan:  To be determined Consults called:  none Admission status: Inpatient/tele     Date of Service 04/05/2018    Lorretta Harp [facility] Pager [phone]  If 7PM-7AM, please contact night-coverage [email or web address] Password [email or web address] 04/20/2018, 3:04 AM

## 2018-04-25 NOTE — Progress Notes (Signed)
Pt brought down as a code blue from 4E. Pt coded multiple times upon arrival, see code sheet for details. Shortly after code started, CCM MD at bedside during codes. Dr. Merlene Pulling spoke with family over phone. Time of death called per MD @ [phone].  Medical examiner to be called per Dr. Merlene Pulling; pt not ME case per Dr. Tiburcio Pea Medical Examiner.

## 2018-04-25 DEATH — deceased

## 2018-10-26 IMAGING — DX DG CHEST 2V
2 series · 2 of 2 positions shown · non-contrast
Comparison: None.

CLINICAL DATA: Two week history of increased shortness of breath
and bilateral lower extremity edema.

EXAM:
CHEST - 2 VIEW

[chest lat]
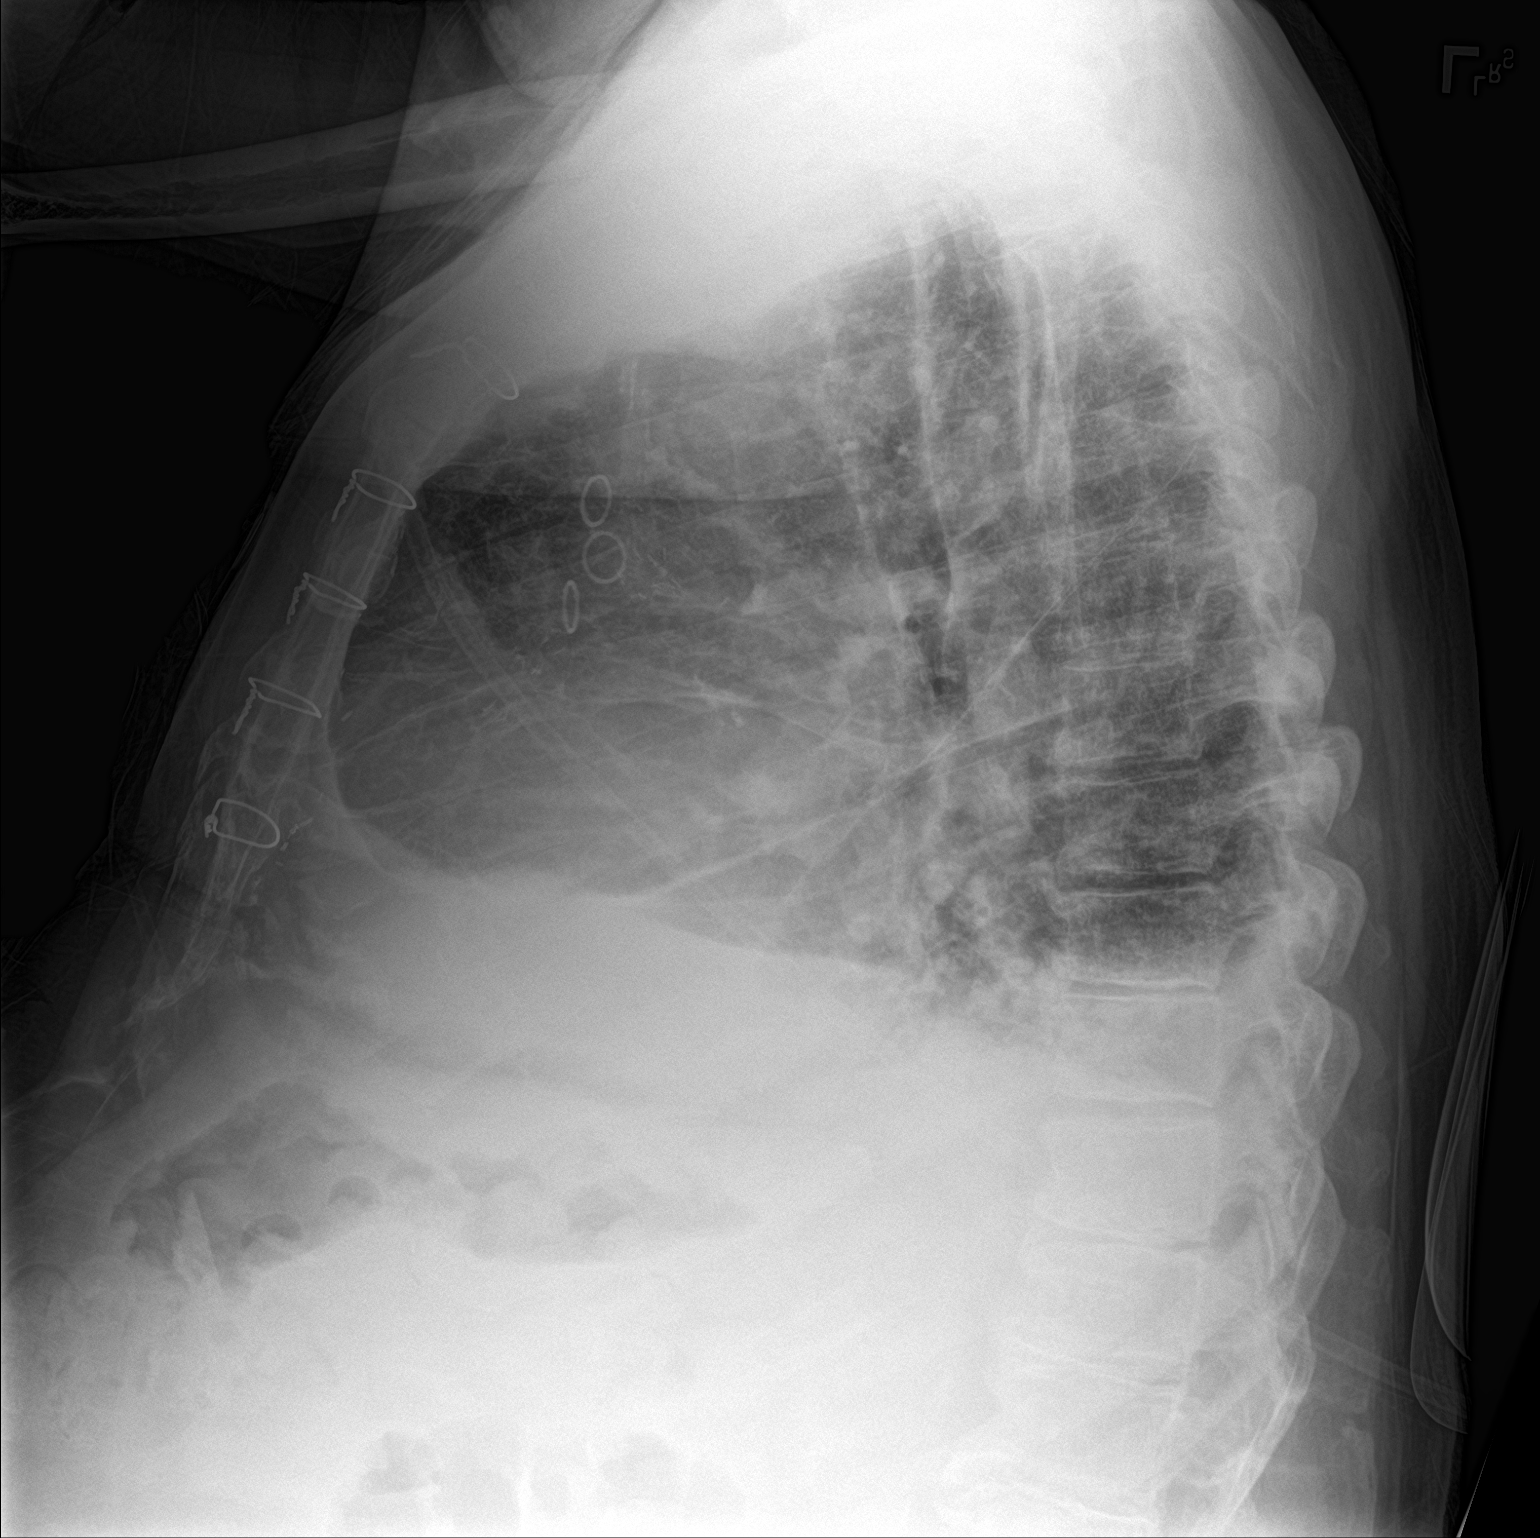

[chest ap]
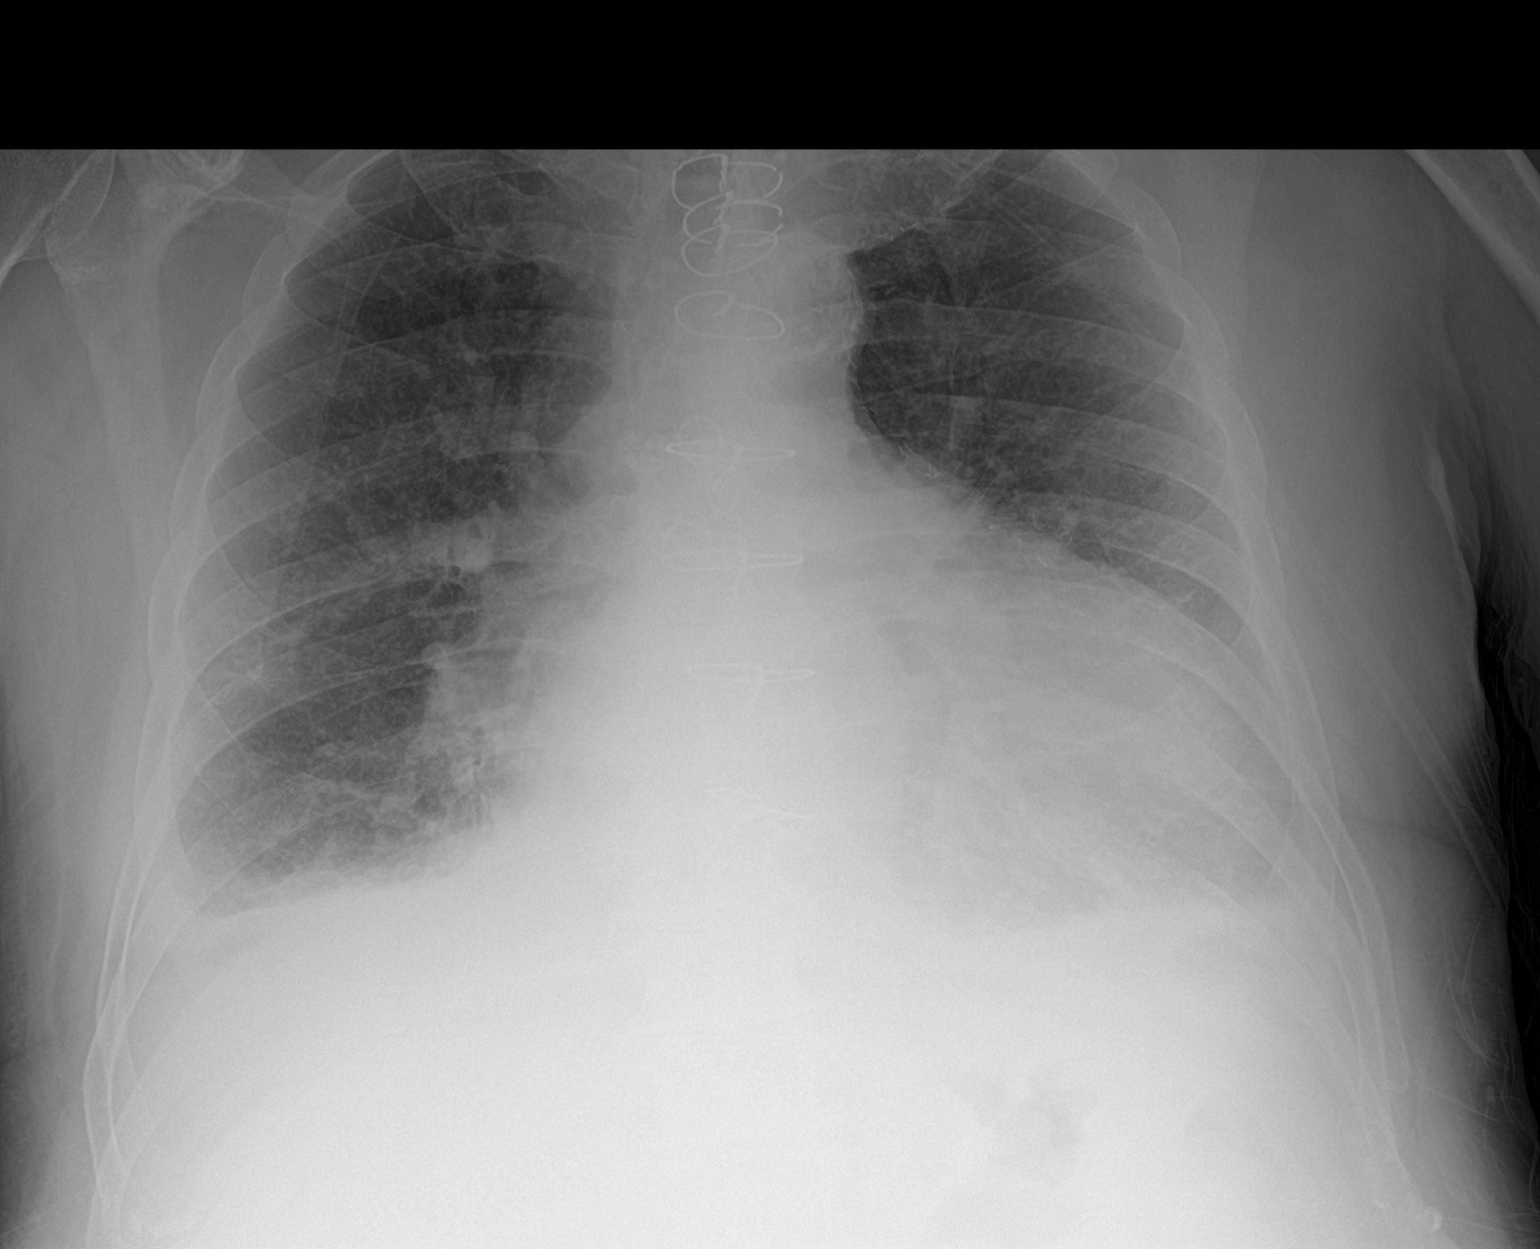

[2 of 2 positions shown; findings below may reference images not displayed]

FINDINGS: Postoperative changes in the mediastinum. Fractures of 2 superior
sternotomy wires. Diffuse cardiac enlargement. Mild pulmonary
vascular congestion. Slight interstitial pattern in the lung bases
likely due to edema. Small bilateral pleural effusions. No focal
consolidation. No pneumothorax. Calcified and tortuous aorta.
Degenerative changes in the spine.
IMPRESSION: 1. Congestive changes with cardiac enlargement, pulmonary vascular
congestion, interstitial edema, and bilateral pleural effusions.
2. Aortic atherosclerosis.
